# Patient Record
Sex: Male | Born: 1955 | Race: White | Hispanic: No | Marital: Married | Smoking: Former smoker
Health system: Southern US, Community
[De-identification: ages and names within clinical notes are randomized; demographics above are authoritative.]

## PROBLEM LIST (undated history)

## (undated) DIAGNOSIS — M1990 Unspecified osteoarthritis, unspecified site: Secondary | ICD-10-CM

## (undated) DIAGNOSIS — E7800 Pure hypercholesterolemia, unspecified: Secondary | ICD-10-CM

## (undated) HISTORY — PX: COLONOSCOPY: SHX174

---

## 2000-03-07 ENCOUNTER — Encounter: Admission: RE | Admit: 2000-03-07 | Discharge: 2000-06-05 | Payer: Self-pay | Admitting: Family Medicine

## 2005-05-03 ENCOUNTER — Ambulatory Visit: Payer: Self-pay | Admitting: Internal Medicine

## 2005-05-18 ENCOUNTER — Ambulatory Visit (HOSPITAL_COMMUNITY): Admission: RE | Admit: 2005-05-18 | Discharge: 2005-05-18 | Payer: Self-pay | Admitting: Internal Medicine

## 2005-05-18 ENCOUNTER — Ambulatory Visit: Payer: Self-pay | Admitting: Internal Medicine

## 2010-10-17 ENCOUNTER — Emergency Department (HOSPITAL_COMMUNITY)
Admission: EM | Admit: 2010-10-17 | Discharge: 2010-10-17 | Disposition: A | Discharge status: Home / Self Care | Payer: 59 | Attending: Emergency Medicine | Admitting: Emergency Medicine

## 2010-10-17 ENCOUNTER — Encounter: Payer: Self-pay | Admitting: *Deleted

## 2010-10-17 ENCOUNTER — Emergency Department (HOSPITAL_COMMUNITY): Payer: 59

## 2010-10-17 DIAGNOSIS — N200 Calculus of kidney: Secondary | ICD-10-CM

## 2010-10-17 DIAGNOSIS — R1032 Left lower quadrant pain: Secondary | ICD-10-CM | POA: Insufficient documentation

## 2010-10-17 DIAGNOSIS — Z794 Long term (current) use of insulin: Secondary | ICD-10-CM | POA: Insufficient documentation

## 2010-10-17 DIAGNOSIS — R1110 Vomiting, unspecified: Secondary | ICD-10-CM | POA: Insufficient documentation

## 2010-10-17 DIAGNOSIS — E119 Type 2 diabetes mellitus without complications: Secondary | ICD-10-CM | POA: Insufficient documentation

## 2010-10-17 LAB — URINE MICROSCOPIC-ADD ON

## 2010-10-17 LAB — URINALYSIS, ROUTINE W REFLEX MICROSCOPIC
Bilirubin Urine: NEGATIVE
Glucose, UA: 250 mg/dL — AB
Nitrite: NEGATIVE
Specific Gravity, Urine: >1.03 — ABNORMAL HIGH (ref 1.005–1.030)
pH: 5.5 (ref 5.0–8.0)

## 2010-10-17 MED ORDER — SODIUM CHLORIDE 0.9 % IV SOLN
Freq: Once | INTRAVENOUS | Status: AC
  Administered 2010-10-17: 05:00:00 via INTRAVENOUS

## 2010-10-17 MED ORDER — HYDROCODONE-ACETAMINOPHEN 5-325 MG PO TABS: 1.0000 | ORAL_TABLET | ORAL | Status: AC | PRN

## 2010-10-17 MED ORDER — HYDROMORPHONE HCL 1 MG/ML IJ SOLN
1.0000 mg | Freq: Once | INTRAMUSCULAR | Status: AC
  Administered 2010-10-17: 1 mg via INTRAVENOUS
  Filled 2010-10-17: qty 1

## 2010-10-17 MED ORDER — KETOROLAC TROMETHAMINE 30 MG/ML IJ SOLN
30.0000 mg | Freq: Once | INTRAMUSCULAR | Status: AC
  Administered 2010-10-17: 30 mg via INTRAVENOUS
  Filled 2010-10-17: qty 1

## 2010-10-17 MED ORDER — ONDANSETRON HCL 4 MG/2ML IJ SOLN
4.0000 mg | Freq: Once | INTRAMUSCULAR | Status: AC
  Administered 2010-10-17: 4 mg via INTRAVENOUS
  Filled 2010-10-17: qty 2

## 2010-10-17 MED ORDER — PROMETHAZINE HCL 25 MG PO TABS: 25.0000 mg | ORAL_TABLET | Freq: Four times a day (QID) | ORAL | Status: AC | PRN

## 2010-10-17 NOTE — ED Provider Notes (Signed)
History     CSN: [id number] Arrival date & time: 10/17/2010  2:52 AM   Chief Complaint  Patient presents with  . Flank Pain  . Emesis     (Include location/radiation/quality/duration/timing/severity/associated sxs/prior treatment) HPI Comments: Seen 25  Patient is a 55 y.o. male presenting with flank pain and vomiting. The history is provided by the patient.  Flank Pain This is a new problem. The current episode started 3 to 5 hours ago (patient awakened at 1030 with left flank pain. Has had two episodes of vomiting. Pain radiates to left abdomen). The problem has been gradually worsening. Associated symptoms include abdominal pain. The symptoms are aggravated by nothing. The symptoms are relieved by nothing. He has tried nothing for the symptoms.  Emesis  Associated symptoms include abdominal pain.     Past Medical History  Diagnosis Date  . Diabetes mellitus      History reviewed. No pertinent past surgical history.  History reviewed. No pertinent family history.  History  Substance Use Topics  . Smoking status: Current Everyday Smoker    Types: Cigarettes  . Smokeless tobacco: Not on file  . Alcohol Use: Yes     occ. use      Review of Systems  Gastrointestinal: Positive for vomiting and abdominal pain.  Genitourinary: Positive for flank pain.    Allergies  Review of patient's allergies indicates no known allergies.  Home Medications   Current Outpatient Rx  Name Route Sig Dispense Refill  . ASPIRIN 81 MG PO TABS Oral Take 81 mg by mouth daily.      . OMEGA-3 FATTY ACIDS 1000 MG PO CAPS Oral Take 3 g by mouth daily.      . INSULIN ASPART 100 UNIT/ML [place] SOLN Subcutaneous Inject into the skin 3 (three) times daily before meals.      . INSULIN DETEMIR 100 UNIT/ML [place] SOLN Subcutaneous Inject 35 Units into the skin 2 (two) times daily.     Marland Kitchen LOSARTAN POTASSIUM 100 MG PO TABS Oral Take 100 mg by mouth daily.      Marland Kitchen LOVASTATIN 20 MG PO TABS Oral Take 20 mg  by mouth at bedtime.      Marland Kitchen METFORMIN HCL 1000 MG PO TABS Oral Take 1,000 mg by mouth 2 (two) times daily with a meal.        Physical Exam    BP 150/80  Pulse 72  Temp 97.8 F (36.6 C)  Resp 16  Ht 5\' 10"  (1.778 m)  Wt 217 lb (98.431 kg)  BMI 31.14 kg/m2  SpO2 97%  Physical Exam  Nursing note and vitals reviewed. Constitutional: He is oriented to person, place, and time. He appears well-developed and well-nourished. He appears distressed.  HENT:  Head: Normocephalic and atraumatic.  Eyes: EOM are normal.  Neck: Normal range of motion. Neck supple.  Cardiovascular: Normal rate, normal heart sounds and intact distal pulses.   Pulmonary/Chest: Breath sounds normal.  Abdominal: Soft.       No focal area of pain with palpation  Genitourinary:       No cva tenderness  Musculoskeletal: Normal range of motion.  Neurological: He is alert and oriented to person, place, and time.  Skin: Skin is warm and dry.    ED Course  Procedures  Results for orders placed during the hospital encounter of 10/17/10  URINALYSIS, ROUTINE W REFLEX MICROSCOPIC      Component Value Range   Color, Urine YELLOW  YELLOW    Appearance  CLEAR  CLEAR    Specific Gravity, Urine >1.030 (*) 1.005 - 1.030    pH 5.5  5.0 - 8.0    Glucose, UA 250 (*) NEGATIVE (mg/dL)   Hgb urine dipstick LARGE (*) NEGATIVE    Bilirubin Urine NEGATIVE  NEGATIVE    Ketones, ur 15 (*) NEGATIVE (mg/dL)   Protein, ur NEGATIVE  NEGATIVE (mg/dL)   Urobilinogen, UA 0.2  0.0 - 1.0 (mg/dL)   Nitrite NEGATIVE  NEGATIVE    Leukocytes, UA NEGATIVE  NEGATIVE   URINE MICROSCOPIC-ADD ON      Component Value Range   Squamous Epithelial / LPF RARE  RARE    WBC, UA 0-2  <3 (WBC/hpf)   RBC / HPF 3-6  <3 (RBC/hpf)   Bacteria, UA RARE  RARE    Crystals CA OXALATE CRYSTALS (*) NEGATIVE    Ct Abdomen Pelvis Wo Contrast  10/17/2010  *RADIOLOGY REPORT*  Clinical Data: Left-sided flank pain.  CT ABDOMEN AND PELVIS WITHOUT CONTRAST   Technique:  Multidetector CT imaging of the abdomen and pelvis was performed following the standard protocol without intravenous contrast.  Comparison: None.  Findings: Limited images through the lung bases demonstrate no significant appreciable abnormality. The heart size is within normal limits. No pleural or pericardial effusion.  Organ evaluation is limited without intravenous contrast.  Within this limitation, diffuse low attenuation of the liver is in keeping with fatty infiltration.  Unremarkable biliary system, spleen, and adrenal glands.  Punctate calcification along the uncinate process may represent sequelae of prior pancreatitis.  There is bilateral perinephric fat stranding, left greater than right.  Interpolar cyst on the right.  A tiny too small to further characterize hypodensity arising exophytic from the right kidney.  Tiny nonobstructing left renal stone upper pole.  There is mild hydroureteronephrosis on the left to the level of a 3 mm left UVJ stone.  No bowel obstruction.  Large bowel diverticulosis without CT evidence for diverticulitis.  Normal appendix.  No free intraperitoneal air or fluid.  Reactive retroperitoneal lymph nodes.  Thin-walled bladder.  Bilateral fat containing inguinal hernias.  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  No acute osseous abnormality.  IMPRESSION: Mild left hydroureteronephrosis to the level of a 3 mm left UVJ stone.  Tiny nonobstructing left upper pole renal stone.  Hepatic steatosis.  Original Report Authenticated By: Waneta Martins, M.D.     Patient with left flank pain radiating to abdomen. No history of kidney stones. Urine with large hgb. History and urine c/w kidney stone. Confirmed by CT. Analgesics and antiemetics resolved pain and nausea. Pt feels improved after observation and/or treatment in ED.Patient and wife  informed of clinical course, understand medical decision-making process, and agree with plan. MDM Reviewed: nursing  note and vitals Interpretation: labs and CT scan      Nicoletta Dress. Colon Branch, MD 10/17/10 [phone]

## 2010-10-17 NOTE — ED Notes (Signed)
Left flank pain since 2230 last night with nausea and vomiting

## 2014-02-12 DIAGNOSIS — Z87891 Personal history of nicotine dependence: Secondary | ICD-10-CM | POA: Insufficient documentation

## 2014-02-12 DIAGNOSIS — K760 Fatty (change of) liver, not elsewhere classified: Secondary | ICD-10-CM | POA: Insufficient documentation

## 2014-02-12 DIAGNOSIS — N200 Calculus of kidney: Secondary | ICD-10-CM | POA: Insufficient documentation

## 2014-02-12 DIAGNOSIS — E669 Obesity, unspecified: Secondary | ICD-10-CM | POA: Insufficient documentation

## 2014-02-12 DIAGNOSIS — I10 Essential (primary) hypertension: Secondary | ICD-10-CM | POA: Insufficient documentation

## 2014-09-03 DIAGNOSIS — M353 Polymyalgia rheumatica: Secondary | ICD-10-CM | POA: Insufficient documentation

## 2016-01-21 ENCOUNTER — Telehealth: Payer: Self-pay

## 2016-01-21 NOTE — Telephone Encounter (Signed)
Patient called to schedule his colonoscopy. He said he received a letter about 5 yrs ago. Looks like he had been seeing NUR back in 2007. Patient doesn't remember who did his last colonoscopy. Please advise if he can be triaged or if he needs to follow up with [place]. [phone]

## 2016-01-27 NOTE — Telephone Encounter (Signed)
LMOM to call and also faxed Medical Records for last colonoscopy report.

## 2016-02-02 ENCOUNTER — Other Ambulatory Visit: Payer: Self-pay

## 2016-02-02 NOTE — Telephone Encounter (Addendum)
MOVI PREP SPLIT DOSING, FULL LIQUIDS WITH BREAKFAST.  insulin aspart (NOVOLOG) 100 UNIT/ML injection Inject 40 Units into the skin 3 (three) times daily before meals.      . Insulin Glargine (TOUJEO SOLOSTAR [place]) Inject 80 Units into the skin at bedtime.    Marland Kitchen losartan (COZAAR) 100 MG tablet Take 100 mg by mouth daily.      Marland Kitchen lovastatin (MEVACOR) 20 MG tablet Take 20 mg by mouth at bedtime.      . metFORMIN (GLUCOPHAGE) 1000 MG tablet Take 1,000 mg by mouth 2 (two) times daily with a meal.      ON DAY PRIOR TO TCS:  USE 5 UNITS NOVOLOG PRIOR TO MEALS IF BLOOD SUGAR > 200 USE 10 UNITS NOVOLOG PRIOR TO MEALS IF BLOOD SUGAR > 300 TAKE HALF TOUJEO ON NIGHT PRIOR TO TCS CONTINUE GLUCOPHAGE  ON MORNING OF TCS:  USE 5 UNITS NOVOLOG PRIOR TO MEALS IF BLOOD SUGAR > 200 USE 10 UNITS NOVOLOG PRIOR TO MEALS IF BLOOD SUGAR > 300 CONTINUE GLUCOPHAGE  Full Liquid Diet A high-calorie, high-protein supplement should be used to meet your nutritional requirements when the full liquid diet is continued for more than 2 or 3 days. If this diet is to be used for an extended period of time (more than 7 days), a multivitamin should be considered.  Breads and Starches  Allowed: None are allowed   Avoid: Any others.    Potatoes/Pasta/Rice  Allowed: ANY ITEM AS A SOUP OR SMALL PLATE OF MASHED POTATOES OR SCRAMBLED EGGS. (DO NOT EAT MORE THAN ONE SERVING ON THE DAY BEFORE COLONOSCOPY).    Vegetables  Allowed: Strained tomato or vegetable juice. Vegetables pureed in soup.   Avoid: Any others.    Fruit  Allowed: Any strained fruit juices and fruit drinks. Include 1 serving of citrus or vitamin C-enriched fruit juice daily.   Avoid: Any others.  Meat and Meat Substitutes  Allowed: Egg  Avoid: Any meat, fish, or fowl. All cheese.  Milk  Allowed: SOY Milk beverages, including milk shakes and instant breakfast mixes. Smooth yogurt.   Avoid: Any others. Avoid dairy products if not  tolerated.    Soups and Combination Foods  Allowed: Broth, strained cream soups. Strained, broth-based soups.   Avoid: Any others.    Desserts and Sweets  Allowed: flavored gelatin, tapioca, ice cream, sherbet, smooth pudding, junket, fruit ices, frozen ice pops, pudding pops, frozen fudge pops, chocolate syrup. Sugar, honey, jelly, syrup.   Avoid: Any others.  Fats and Oils  Allowed: Margarine, butter, cream, sour cream, oils.   Avoid: Any others.  Beverages  Allowed: All.   Avoid: None.  Condiments  Allowed: Iodized salt, pepper, spices, flavorings. Cocoa powder.   Avoid: Any others.    SAMPLE MEAL PLAN Breakfast   cup orange juice.   1 OR 2 EGGS  1 cup milk.   1 cup beverage (coffee or tea).   Cream or sugar, if desired.    Midmorning Snack  2 SCRAMBLED OR HARD BOILED EGG   Lunch  1 cup cream soup.    cup fruit juice.   1 cup milk.    cup custard.   1 cup beverage (coffee or tea).   Cream or sugar, if desired.    Midafternoon Snack  1 cup milk shake.  Dinner  1 cup cream soup.    cup fruit juice.   1 cup MILK    cup pudding.   1 cup beverage (coffee or  tea).   Cream or sugar, if desired.  Evening Snack  1 cup supplement.  To increase calories, add sugar, cream, butter, or margarine if possible. Nutritional supplements will also increase the total calories.

## 2016-02-02 NOTE — Telephone Encounter (Signed)
Last colonoscopy was done by Dr. Laural Golden on 05/18/2005.  Rectum biopsy: tubular adenoma. Triaged today for colonoscopy.

## 2016-02-02 NOTE — Telephone Encounter (Signed)
Gastroenterology Pre-Procedure Review  Request Date: 02/02/2016 Requesting Physician:   PATIENT REVIEW QUESTIONS: The patient responded to the following health history questions as indicated:    1. Diabetes Melitis: YES 2. Joint replacements in the past 12 months: no 3. Major health problems in the past 3 months: no 4. Has an artificial valve or MVP: no 5. Has a defibrillator: no 6. Has been advised in past to take antibiotics in advance of a procedure like teeth cleaning: no 7. Family history of colon cancer: no  8. Alcohol Use: no 9. History of sleep apnea: no  10. History of coronary artery or other vascular stents placed within the last 12 months: no    MEDICATIONS & ALLERGIES:    Patient reports the following regarding taking any blood thinners:   Plavix? no Aspirin?  YES Coumadin? no Brilinta? no Xarelto? no Eliquis? no Pradaxa? no Savaysa? no Effient? no  Patient confirms/reports the following medications:  Current Outpatient Prescriptions  Medication Sig Dispense Refill  . aspirin 81 MG tablet Take 81 mg by mouth daily.      . fish oil-omega-3 fatty acids 1000 MG capsule Take 3 g by mouth daily.      . insulin aspart (NOVOLOG) 100 UNIT/ML injection Inject 40 Units into the skin 3 (three) times daily before meals.     . Insulin Glargine (TOUJEO SOLOSTAR [place]) Inject 80 Units into the skin at bedtime.    Marland Kitchen losartan (COZAAR) 100 MG tablet Take 100 mg by mouth daily.      Marland Kitchen lovastatin (MEVACOR) 20 MG tablet Take 20 mg by mouth at bedtime.      . metFORMIN (GLUCOPHAGE) 1000 MG tablet Take 1,000 mg by mouth 2 (two) times daily with a meal.       No current facility-administered medications for this visit.     Patient confirms/reports the following allergies:  No Known Allergies  No orders of the defined types were placed in this encounter.   AUTHORIZATION INFORMATION Primary Insurance:  ID #:  Group #:  Pre-Cert / Auth required:  Pre-Cert / Auth #:   Secondary  Insurance:   ID #:   Group #:  Pre-Cert / Auth required:  Pre-Cert / Auth #:   SCHEDULE INFORMATION: Procedure has been scheduled as follows:  Date: 02/08/2016            Time:10:00 AM Location: [facility] Short Stay  This Gastroenterology Pre-Precedure Review Form is being routed to the following provider(s): Barney Drain, MD

## 2016-02-04 ENCOUNTER — Other Ambulatory Visit: Payer: Self-pay

## 2016-02-04 DIAGNOSIS — Z1211 Encounter for screening for malignant neoplasm of colon: Secondary | ICD-10-CM

## 2016-02-04 NOTE — Addendum Note (Signed)
Addended by: Everardo All on: 02/04/2016 09:01 AM   Modules accepted: Orders

## 2016-02-04 NOTE — Telephone Encounter (Signed)
I called the Movie [facility] to [place] at [facility] on [facility] in [place].[phone]) I told him I was faxing the instructions and to make sure the pt gets them.[phone] I also called and LMOM for pt that the Rx has been called in and his instructions were faxed there.

## 2016-02-07 NOTE — Telephone Encounter (Signed)
PA # [id number] for the colonoscopy.

## 2016-02-08 ENCOUNTER — Encounter (HOSPITAL_COMMUNITY)
Admission: RE | Disposition: A | Discharge status: Home / Self Care | Payer: Self-pay | Source: Ambulatory Visit | Attending: Gastroenterology

## 2016-02-08 ENCOUNTER — Encounter (HOSPITAL_COMMUNITY): Payer: Self-pay | Admitting: *Deleted

## 2016-02-08 ENCOUNTER — Ambulatory Visit (HOSPITAL_COMMUNITY)
Admission: RE | Admit: 2016-02-08 | Discharge: 2016-02-08 | Disposition: A | Discharge status: Home / Self Care | Payer: 59 | Source: Ambulatory Visit | Attending: Gastroenterology | Admitting: Gastroenterology

## 2016-02-08 DIAGNOSIS — Z87891 Personal history of nicotine dependence: Secondary | ICD-10-CM | POA: Insufficient documentation

## 2016-02-08 DIAGNOSIS — Q438 Other specified congenital malformations of intestine: Secondary | ICD-10-CM | POA: Diagnosis not present

## 2016-02-08 DIAGNOSIS — Z1211 Encounter for screening for malignant neoplasm of colon: Secondary | ICD-10-CM | POA: Diagnosis present

## 2016-02-08 DIAGNOSIS — Z1212 Encounter for screening for malignant neoplasm of rectum: Secondary | ICD-10-CM | POA: Diagnosis not present

## 2016-02-08 DIAGNOSIS — K648 Other hemorrhoids: Secondary | ICD-10-CM | POA: Diagnosis not present

## 2016-02-08 DIAGNOSIS — Z7982 Long term (current) use of aspirin: Secondary | ICD-10-CM | POA: Insufficient documentation

## 2016-02-08 DIAGNOSIS — Z79899 Other long term (current) drug therapy: Secondary | ICD-10-CM | POA: Insufficient documentation

## 2016-02-08 DIAGNOSIS — Z794 Long term (current) use of insulin: Secondary | ICD-10-CM | POA: Insufficient documentation

## 2016-02-08 DIAGNOSIS — E7800 Pure hypercholesterolemia, unspecified: Secondary | ICD-10-CM | POA: Diagnosis not present

## 2016-02-08 DIAGNOSIS — M1990 Unspecified osteoarthritis, unspecified site: Secondary | ICD-10-CM | POA: Insufficient documentation

## 2016-02-08 DIAGNOSIS — K644 Residual hemorrhoidal skin tags: Secondary | ICD-10-CM | POA: Diagnosis not present

## 2016-02-08 DIAGNOSIS — E119 Type 2 diabetes mellitus without complications: Secondary | ICD-10-CM | POA: Diagnosis not present

## 2016-02-08 DIAGNOSIS — K621 Rectal polyp: Secondary | ICD-10-CM | POA: Insufficient documentation

## 2016-02-08 HISTORY — PX: COLONOSCOPY: SHX5424

## 2016-02-08 HISTORY — DX: Unspecified osteoarthritis, unspecified site: M19.90

## 2016-02-08 HISTORY — DX: Pure hypercholesterolemia, unspecified: E78.00

## 2016-02-08 LAB — GLUCOSE, CAPILLARY: Glucose-Capillary: 226 mg/dL — ABNORMAL HIGH (ref 65–99)

## 2016-02-08 SURGERY — COLONOSCOPY
Anesthesia: Moderate Sedation

## 2016-02-08 MED ORDER — MEPERIDINE HCL 100 MG/ML IJ SOLN
INTRAMUSCULAR | Status: AC
  Filled 2016-02-08: qty 2

## 2016-02-08 MED ORDER — MEPERIDINE HCL 100 MG/ML IJ SOLN
INTRAMUSCULAR | Status: DC | PRN
  Administered 2016-02-08 (×3): 25 mg via INTRAVENOUS

## 2016-02-08 MED ORDER — MIDAZOLAM HCL 5 MG/5ML IJ SOLN
INTRAMUSCULAR | Status: AC
  Filled 2016-02-08: qty 10

## 2016-02-08 MED ORDER — STERILE WATER FOR IRRIGATION IR SOLN
Status: DC | PRN
  Administered 2016-02-08: 10:00:00

## 2016-02-08 MED ORDER — MIDAZOLAM HCL 5 MG/5ML IJ SOLN
INTRAMUSCULAR | Status: DC | PRN
  Administered 2016-02-08: 2 mg via INTRAVENOUS
  Administered 2016-02-08: 1 mg via INTRAVENOUS
  Administered 2016-02-08: 2 mg via INTRAVENOUS

## 2016-02-08 MED ORDER — SODIUM CHLORIDE 0.9 % IV SOLN
INTRAVENOUS | Status: DC
  Administered 2016-02-08: 09:00:00 via INTRAVENOUS

## 2016-02-08 MED ORDER — PEG-KCL-NACL-NASULF-NA ASC-C 100 G PO SOLR: 1.0000 | ORAL | 0 refills | Status: DC

## 2016-02-08 NOTE — Discharge Instructions (Signed)
You had 1 polyp removed. You have moderate internal hemorrhoids.   CONTINUE YOUR WEIGHT LOSS EFFORTS. LOSE TEN POUNDS.  WHILE I DO NOT WANT TO ALARM YOU, YOUR BODY MASS INDEX IS OVER 30 WHICH MEANS YOU ARE OBESE. OBESITY IS ASSOCIATED WITH AN INCREASE RISK FOR ALL CANCERS, INCLUDING ESOPHAGEAL AND COLON CANCER.   DRINK WATER TO KEEP YOUR URINE LIGHT YELLOW.  FOLLOW A HIGH FIBER DIET. AVOID ITEMS THAT CAUSE BLOATING & GAS. SEE INFO BELOW.  YOUR BIOPSY RESULTS WILL BE AVAILABLE IN MY CHART AFTER JAN 12 AND MY OFFICE WILL CONTACT YOU IN 10-14 DAYS WITH YOUR RESULTS.   Next colonoscopy in 5-10 years.    Colonoscopy Care After Read the instructions outlined below and refer to this sheet in the next week. These discharge instructions provide you with general information on caring for yourself after you leave the hospital. While your treatment has been planned according to the most current medical practices available, unavoidable complications occasionally occur. If you have any problems or questions after discharge, call DR. Abrahim Sargent, [phone].  ACTIVITY  You may resume your regular activity, but move at a slower pace for the next 24 hours.   Take frequent rest periods for the next 24 hours.   Walking will help get rid of the air and reduce the bloated feeling in your belly (abdomen).   No driving for 24 hours (because of the medicine (anesthesia) used during the test).   You may shower.   Do not sign any important legal documents or operate any machinery for 24 hours (because of the anesthesia used during the test).    NUTRITION  Drink plenty of fluids.   You may resume your normal diet as instructed by your doctor.   Begin with a light meal and progress to your normal diet. Heavy or fried foods are harder to digest and may make you feel sick to your stomach (nauseated).   Avoid alcoholic beverages for 24 hours or as instructed.    MEDICATIONS  You may resume your normal  medications.   WHAT YOU CAN EXPECT TODAY  Some feelings of bloating in the abdomen.   Passage of more gas than usual.   Spotting of blood in your stool or on the toilet paper  .  IF YOU HAD POLYPS REMOVED DURING THE COLONOSCOPY:  Eat a soft diet IF YOU HAVE NAUSEA, BLOATING, ABDOMINAL PAIN, OR VOMITING.    FINDING OUT THE RESULTS OF YOUR TEST Not all test results are available during your visit. DR. Oneida Alar WILL CALL YOU WITHIN 14 DAYS OF YOUR PROCEDUE WITH YOUR RESULTS. Do not assume everything is normal if you have not heard from DR. Azlin Zilberman, CALL HER OFFICE AT [phone].  SEEK IMMEDIATE MEDICAL ATTENTION AND CALL THE OFFICE: [phone] IF:  You have more than a spotting of blood in your stool.   Your belly is swollen (abdominal distention).   You are nauseated or vomiting.   You have a temperature over 101F.   You have abdominal pain or discomfort that is severe or gets worse throughout the day.   High-Fiber Diet A high-fiber diet changes your normal diet to include more whole grains, legumes, fruits, and vegetables. Changes in the diet involve replacing refined carbohydrates with unrefined foods. The calorie level of the diet is essentially unchanged. The Dietary Reference Intake (recommended amount) for adult males is 38 grams per day. For adult females, it is 25 grams per day. Pregnant and lactating women should consume 28 grams  of fiber per day. Fiber is the intact part of a plant that is not broken down during digestion. Functional fiber is fiber that has been isolated from the plant to provide a beneficial effect in the body. PURPOSE  Increase stool bulk.   Ease and regulate bowel movements.   Lower cholesterol.   REDUCE RISK OF COLON CANCER  INDICATIONS THAT YOU NEED MORE FIBER  Constipation and hemorrhoids.   Uncomplicated diverticulosis (intestine condition) and irritable bowel syndrome.   Weight management.   As a protective measure against  hardening of the arteries (atherosclerosis), diabetes, and cancer.   GUIDELINES FOR INCREASING FIBER IN THE DIET  Start adding fiber to the diet slowly. A gradual increase of about 5 more grams (2 slices of whole-wheat bread, 2 servings of most fruits or vegetables, or 1 bowl of high-fiber cereal) per day is best. Too rapid an increase in fiber may result in constipation, flatulence, and bloating.   Drink enough water and fluids to keep your urine clear or pale yellow. Water, juice, or caffeine-free drinks are recommended. Not drinking enough fluid may cause constipation.   Eat a variety of high-fiber foods rather than one type of fiber.   Try to increase your intake of fiber through using high-fiber foods rather than fiber pills or supplements that contain small amounts of fiber.   The goal is to change the types of food eaten. Do not supplement your present diet with high-fiber foods, but replace foods in your present diet.   INCLUDE A VARIETY OF FIBER SOURCES  Replace refined and processed grains with whole grains, canned fruits with fresh fruits, and incorporate other fiber sources. White rice, white breads, and most bakery goods contain little or no fiber.   Brown whole-grain rice, buckwheat oats, and many fruits and vegetables are all good sources of fiber. These include: broccoli, Brussels sprouts, cabbage, cauliflower, beets, sweet potatoes, white potatoes (skin on), carrots, tomatoes, eggplant, squash, berries, fresh fruits, and dried fruits.   Cereals appear to be the richest source of fiber. Cereal fiber is found in whole grains and bran. Bran is the fiber-rich outer coat of cereal grain, which is largely removed in refining. In whole-grain cereals, the bran remains. In breakfast cereals, the largest amount of fiber is found in those with "bran" in their names. The fiber content is sometimes indicated on the label.   You may need to include additional fruits and vegetables each day.     In baking, for 1 cup white flour, you may use the following substitutions:   1 cup whole-wheat flour minus 2 tablespoons.   1/2 cup white flour plus 1/2 cup whole-wheat flour.   Polyps, Colon  A polyp is extra tissue that grows inside your body. Colon polyps grow in the large intestine. The large intestine, also called the colon, is part of your digestive system. It is a long, hollow tube at the end of your digestive tract where your body makes and stores stool. Most polyps are not dangerous. They are benign. This means they are not cancerous. But over time, some types of polyps can turn into cancer. Polyps that are smaller than a pea are usually not harmful. But larger polyps could someday become or may already be cancerous. To be safe, doctors remove all polyps and test them.   WHO GETS POLYPS? Anyone can get polyps, but certain people are more likely than others. You may have a greater chance of getting polyps if:  You are over  32.   You have had polyps before.   Someone in your family has had polyps.   Someone in your family has had cancer of the large intestine.   Find out if someone in your family has had polyps. You may also be more likely to get polyps if you:   Eat a lot of fatty foods   Smoke   Drink alcohol   Do not exercise  Eat too much   PREVENTION There is not one sure way to prevent polyps. You might be able to lower your risk of getting them if you:  Eat more fruits and vegetables and less fatty food.   Do not smoke.   Avoid alcohol.   Exercise every day.   Lose weight if you are overweight.   Eating more calcium and folate can also lower your risk of getting polyps. Some foods that are rich in calcium are milk, cheese, and broccoli. Some foods that are rich in folate are chickpeas, kidney beans, and spinach.   Hemorrhoids Hemorrhoids are dilated (enlarged) veins around the rectum. Sometimes clots will form in the veins. This makes them swollen and  painful. These are called thrombosed hemorrhoids. Causes of hemorrhoids include:  Constipation.   Straining to have a bowel movement.   HEAVY LIFTING  HOME CARE INSTRUCTIONS  Eat a well balanced diet and drink 6 to 8 glasses of water every day to avoid constipation. You may also use a bulk laxative.   Avoid straining to have bowel movements.   Keep anal area dry and clean.   Do not use a donut shaped pillow or sit on the toilet for long periods. This increases blood pooling and pain.   Move your bowels when your body has the urge; this will require less straining and will decrease pain and pressure.

## 2016-02-08 NOTE — Op Note (Addendum)
[facility] Patient Name: Todd Carey Procedure Date: 02/08/2016 8:20 AM MRN: [id number] Date of Birth: 1955-03-10 Attending MD: Barney Drain , MD CSN: [id number] Age: 61 Admit Type: Outpatient Procedure:                Colonoscopy with COLD SNARE POLYPECTOMY Indications:              Screening for colorectal malignant neoplasm Providers:                Barney Drain, MD, Janeece Riggers, RN, Aram Candela Referring MD:             Dairl Ponder Medicines:                Meperidine 75 mg IV, Midazolam 5 mg IV Complications:            No immediate complications. Estimated Blood Loss:     Estimated blood loss was minimal. Procedure:                Pre-Anesthesia Assessment:                           - Prior to the procedure, a History and Physical                            was performed, and patient medications and                            allergies were reviewed. The patient's tolerance of                            previous anesthesia was also reviewed. The risks                            and benefits of the procedure and the sedation                            options and risks were discussed with the patient.                            All questions were answered, and informed consent                            was obtained. Prior Anticoagulants: The patient has                            taken aspirin, last dose was 1 day prior to                            procedure. ASA Grade Assessment: II - A patient                            with mild systemic disease. After reviewing the                            risks and benefits, the patient was deemed in  satisfactory condition to undergo the procedure.                            After obtaining informed consent, the colonoscope                            was passed under direct vision. Throughout the                            procedure, the patient's blood pressure, pulse, and      oxygen saturations were monitored continuously. The                            EC-[id number] scope was introduced through                            the anus and advanced to the 2 cm into the ileum.                            The colonoscopy was somewhat difficult due to a                            tortuous colon. Successful completion of the                            procedure was aided by COLOWRAP. The patient                            tolerated the procedure well. The quality of the                            bowel preparation was excellent. The terminal                            ileum, ileocecal valve, appendiceal orifice, and                            rectum were photographed. Scope In: 10:03:55 AM Scope Out: 10:19:25 AM Scope Withdrawal Time: 0 hours 13 minutes 11 seconds  Total Procedure Duration: 0 hours 15 minutes 30 seconds  Findings:      The digital rectal exam findings include non-thrombosed external       hemorrhoids.      A 5 mm polyp was found in the rectum. The polyp was sessile. The polyp       was removed with a cold snare. Resection and retrieval were complete.      The recto-sigmoid colon and sigmoid colon were moderately redundant.      Non-bleeding internal hemorrhoids were found. The hemorrhoids were       moderate. Impression:               - Non-thrombosed external hemorrhoids found on                            digital rectal exam.                           -  One 5 mm polyp in the rectum, removed with a cold                            snare. Resected and retrieved.                           - Redundant LEFT colon.                           - Non-bleeding internal hemorrhoids. Moderate Sedation:      Moderate (conscious) sedation was administered by the endoscopy nurse       and supervised by the endoscopist. The following parameters were       monitored: oxygen saturation, heart rate, blood pressure, and response       to care. Total physician  intraservice time was 34 minutes. Recommendation:           - High fiber diet.                           - Continue present medications.                           - Await pathology results.                           - Repeat colonoscopy in 5-10 years for surveillance.                           - Patient has a contact number available for                            emergencies. The signs and symptoms of potential                            delayed complications were discussed with the                            patient. Return to normal activities tomorrow.                            Written discharge instructions were provided to the                            patient. Procedure Code(s):        --- Professional ---                           [phone], Colonoscopy, flexible; with removal of                            tumor(s), polyp(s), or other lesion(s) by snare                            technique  [id number], Moderate sedation services provided by the                            same physician or other qualified health care                            professional performing the diagnostic or                            therapeutic service that the sedation supports,                            requiring the presence of an independent trained                            observer to assist in the monitoring of the                            patient's level of consciousness and physiological                            status; initial 15 minutes of intraservice time,                            patient age 28 years or older                           [phone], Moderate sedation services; each additional                            15 minutes intraservice time Diagnosis Code(s):        --- Professional ---                           Z12.11, Encounter for screening for malignant                            neoplasm of colon                           K62.1, Rectal polyp                            K64.4, Residual hemorrhoidal skin tags                           K64.8, Other hemorrhoids                           Q43.8, Other specified congenital malformations of                            intestine CPT copyright 2016 [employer]. All rights reserved. The codes documented in this report are preliminary and upon coder review may  be revised to meet current compliance requirements. Barney Drain, MD Barney Drain, MD 02/08/2016 10:33:51  AM This report has been signed electronically. Number of Addenda: 0

## 2016-02-08 NOTE — H&P (Signed)
  Primary Care Physician:  Dairl Ponder, MD Primary Gastroenterologist:  Dr. Oneida Alar  Pre-Procedure History & Physical: HPI:  Todd Carey is a 61 y.o. male here for COLON CANCER SCREENING.  Past Medical History:  Diagnosis Date  . Arthritis    Right shoulder   . Diabetes mellitus   . Hypercholesteremia     Past Surgical History:  Procedure Laterality Date  . COLONOSCOPY      Prior to Admission medications   Medication Sig Start Date End Date Taking? Authorizing Provider  aspirin 81 MG tablet Take 81 mg by mouth daily.     Yes Historical Provider, MD  Cholecalciferol (VITAMIN D) 2000 units tablet Take 2,000 Units by mouth daily.   Yes Historical Provider, MD  fish oil-omega-3 fatty acids 1000 MG capsule Take 3 g by mouth daily.     Yes Historical Provider, MD  insulin aspart (NOVOLOG) 100 UNIT/ML injection Inject 40 Units into the skin 3 (three) times daily before meals.    Yes Historical Provider, MD  Insulin Glargine (TOUJEO SOLOSTAR [place]) Inject 80 Units into the skin at bedtime.   Yes Historical Provider, MD  losartan (COZAAR) 100 MG tablet Take 100 mg by mouth daily.     Yes Historical Provider, MD  lovastatin (MEVACOR) 20 MG tablet Take 20 mg by mouth at bedtime.     Yes Historical Provider, MD  metFORMIN (GLUCOPHAGE) 1000 MG tablet Take 1,000 mg by mouth 2 (two) times daily with a meal.     Yes Historical Provider, MD  peg 3350 powder (MOVIPREP) 100 g SOLR Take 1 kit (200 g total) by mouth as directed. 02/08/16  Yes Danie Binder, MD    Allergies as of 02/04/2016  . (No Known Allergies)    Family History  Problem Relation Age of Onset  . Colon cancer Neg Hx     Social History   Social History  . Marital status: Married    Spouse name: N/A  . Number of children: N/A  . Years of education: N/A   Occupational History  . Not on file.   Social History Main Topics  . Smoking status: Former Smoker    Packs/day: 1.00    Years: 38.00    Types: Cigarettes   Quit date: 04/18/2013  . Smokeless tobacco: Former Systems developer  . Alcohol use No  . Drug use: No  . Sexual activity: Not on file   Other Topics Concern  . Not on file   Social History Narrative  . No narrative on file    Review of Systems: See HPI, otherwise negative ROS   Physical Exam: BP (!) 159/82   Pulse 77   Temp 97.4 F (36.3 C) (Oral)   Resp 18   Ht '5\' 10"'$  (1.778 m)   Wt 222 lb (100.7 kg)   SpO2 98%   BMI 31.85 kg/m  General:   Alert,  pleasant and cooperative in NAD Head:  Normocephalic and atraumatic. Neck:  Supple; Lungs:  Clear throughout to auscultation.    Heart:  Regular rate and rhythm. Abdomen:  Soft, nontender and nondistended. Normal bowel sounds, without guarding, and without rebound.   Neurologic:  Alert and  oriented x4;  grossly normal neurologically.  Impression/Plan:     SCREENING  Plan:  1. TCS TODAY. DISCUSSED PROCEDURE, BENEFITS, & RISKS: < 1% chance of medication reaction, bleeding, perforation, or rupture of spleen/liver.

## 2016-02-10 ENCOUNTER — Encounter (HOSPITAL_COMMUNITY): Payer: Self-pay | Admitting: Gastroenterology

## 2016-02-15 ENCOUNTER — Telehealth: Payer: Self-pay | Admitting: Gastroenterology

## 2016-02-15 NOTE — Telephone Encounter (Signed)
PT is aware.

## 2016-02-15 NOTE — Telephone Encounter (Signed)
Please call pt. He had A HYPERPLASTIC POLYP removed.  CONTINUE YOUR WEIGHT LOSS EFFORTS. DRINK WATER TO KEEP YOUR URINE LIGHT YELLOW. FOLLOW A HIGH FIBER DIET. AVOID ITEMS THAT CAUSE BLOATING & GAS.  Next colonoscopy in 5-10 years BE CAUSE YOU HAD A SIMPLE ADENOMA REMOVED ON YOUR FIRST COLONOSCOPY.

## 2016-02-15 NOTE — Telephone Encounter (Signed)
LMOM to call.

## 2016-02-15 NOTE — Telephone Encounter (Signed)
Reminder in epic °

## 2017-04-23 LAB — BASIC METABOLIC PANEL
BUN: 14 (ref 4–21)
CREATININE: 1 (ref <=1.3)

## 2017-04-23 LAB — HEMOGLOBIN A1C: HEMOGLOBIN A1C: 9.3

## 2017-06-06 ENCOUNTER — Ambulatory Visit: Payer: Self-pay | Admitting: "Endocrinology

## 2017-07-09 ENCOUNTER — Ambulatory Visit: Payer: 59 | Admitting: "Endocrinology

## 2017-07-09 ENCOUNTER — Encounter: Payer: Self-pay | Admitting: "Endocrinology

## 2017-07-09 VITALS — BP 142/74 | HR 80 | Ht 70.0 in | Wt 237.0 lb

## 2017-07-09 DIAGNOSIS — E782 Mixed hyperlipidemia: Secondary | ICD-10-CM

## 2017-07-09 DIAGNOSIS — Z6834 Body mass index (BMI) 34.0-34.9, adult: Secondary | ICD-10-CM | POA: Diagnosis not present

## 2017-07-09 DIAGNOSIS — I10 Essential (primary) hypertension: Secondary | ICD-10-CM | POA: Diagnosis not present

## 2017-07-09 DIAGNOSIS — E1165 Type 2 diabetes mellitus with hyperglycemia: Secondary | ICD-10-CM | POA: Insufficient documentation

## 2017-07-09 DIAGNOSIS — E6609 Other obesity due to excess calories: Secondary | ICD-10-CM | POA: Diagnosis not present

## 2017-07-09 NOTE — Progress Notes (Signed)
Endocrinology Consult Note       07/09/2017, 4:54 PM   Subjective:    Patient ID: Todd Carey, male    DOB: May 29, 1955.  Todd Carey is being seen in consultation for management of currently uncontrolled symptomatic diabetes requested by  Dairl Ponder, MD.   Past Medical History:  Diagnosis Date  . Arthritis    Right shoulder   . Diabetes mellitus   . Hypercholesteremia    Past Surgical History:  Procedure Laterality Date  . COLONOSCOPY    . COLONOSCOPY N/A 02/08/2016   Procedure: COLONOSCOPY;  Surgeon: Danie Binder, MD;  Location: [facility];  Service: Endoscopy;  Laterality: N/A;  10:00 Am   Social History   Socioeconomic History  . Marital status: Married    Spouse name: Not on file  . Number of children: Not on file  . Years of education: Not on file  . Highest education level: Not on file  Occupational History  . Not on file  Social Needs  . Financial resource strain: Not on file  . Food insecurity:    Worry: Not on file    Inability: Not on file  . Transportation needs:    Medical: Not on file    Non-medical: Not on file  Tobacco Use  . Smoking status: Former Smoker    Packs/day: 1.00    Years: 38.00    Pack years: 38.00    Types: Cigarettes    Last attempt to quit: 04/18/2013    Years since quitting: 4.2  . Smokeless tobacco: Former Network engineer and Sexual Activity  . Alcohol use: No  . Drug use: No  . Sexual activity: Not on file  Lifestyle  . Physical activity:    Days per week: Not on file    Minutes per session: Not on file  . Stress: Not on file  Relationships  . Social connections:    Talks on phone: Not on file    Gets together: Not on file    Attends religious service: Not on file    Active member of club or organization: Not on file    Attends meetings of clubs or organizations: Not on file    Relationship status: Not on file  Other  Topics Concern  . Not on file  Social History Narrative  . Not on file   Outpatient Encounter Medications as of 07/09/2017  Medication Sig  . atorvastatin (LIPITOR) 40 MG tablet Take 40 mg by mouth daily.  . Exenatide ER 2 MG/0.85ML AUIJ Inject into the skin once a week.  . Insulin Detemir (LEVEMIR FLEXTOUCH) 100 UNIT/ML Pen Inject 80 Units into the skin at bedtime. 40 units qam & 80 units at night   . insulin lispro (HUMALOG KWIKPEN) 100 UNIT/ML KiwkPen Inject 20-26 Units into the skin 3 (three) times daily before meals.  Marland Kitchen losartan (COZAAR) 100 MG tablet Take 100 mg by mouth daily.  Marland Kitchen aspirin 81 MG tablet Take 81 mg by mouth daily.    . Cholecalciferol (VITAMIN D) 2000 units tablet Take 2,000 Units by mouth daily.  . fish oil-omega-3 fatty acids  1000 MG capsule Take 3 g by mouth daily.    . metFORMIN (GLUCOPHAGE) 1000 MG tablet Take 1,000 mg by mouth 2 (two) times daily with a meal.    . [DISCONTINUED] insulin aspart (NOVOLOG) 100 UNIT/ML injection Inject 40 Units into the skin 3 (three) times daily before meals.   . [DISCONTINUED] Insulin Glargine (TOUJEO SOLOSTAR [place]) Inject 80 Units into the skin at bedtime.  . [DISCONTINUED] losartan (COZAAR) 100 MG tablet Take 100 mg by mouth daily.    . [DISCONTINUED] lovastatin (MEVACOR) 20 MG tablet Take 20 mg by mouth at bedtime.     No facility-administered encounter medications on file as of 07/09/2017.     ALLERGIES: No Known Allergies  VACCINATION STATUS:  There is no immunization history on file for this patient.  Diabetes  He presents for his initial diabetic visit. He has type 2 diabetes mellitus. Onset time: He was diagnosed at approximate age of 62 years. His disease course has been worsening. There are no hypoglycemic associated symptoms. Pertinent negatives for hypoglycemia include no confusion, headaches, pallor or seizures. Associated symptoms include blurred vision, polydipsia and polyuria. Pertinent negatives for diabetes  include no chest pain, no fatigue, no polyphagia and no weakness. There are no hypoglycemic complications. Symptoms are worsening. Risk factors for coronary artery disease include diabetes mellitus, dyslipidemia, family history, male sex, hypertension, obesity, sedentary lifestyle and tobacco exposure. Current diabetic treatments: He is currently on Levemir 120 units daily, Humalog 60 units 3 times daily, Bydureon 2 mg weekly, metformin 1000 mg twice a day. His weight is increasing steadily. He is following a generally unhealthy diet. When asked about meal planning, he reported none. He has not had a previous visit with a dietitian. He rarely participates in exercise. His home blood glucose trend is fluctuating minimally. (He brought a log showing significantly fluctuating, however significantly above target blood glucose profile.  He did not document his insulin injection activities.    His most recent A1c was 9.3% on April 23, 2017.) An ACE inhibitor/angiotensin II receptor blocker is being taken.  Hyperlipidemia  This is a chronic problem. The current episode started more than 1 year ago. The problem is uncontrolled. Exacerbating diseases include diabetes and obesity. Pertinent negatives include no chest pain, myalgias or shortness of breath. Current antihyperlipidemic treatment includes statins. Risk factors for coronary artery disease include dyslipidemia, diabetes mellitus, family history, male sex, obesity, hypertension and a sedentary lifestyle.  Hypertension  This is a chronic problem. The current episode started more than 1 year ago. The problem is uncontrolled. Associated symptoms include blurred vision. Pertinent negatives include no chest pain, headaches, neck pain, palpitations or shortness of breath. Risk factors for coronary artery disease include dyslipidemia, diabetes mellitus, obesity, male gender, smoking/tobacco exposure and sedentary lifestyle. Past treatments include angiotensin blockers.       Review of Systems  Constitutional: Negative for chills, fatigue, fever and unexpected weight change.  HENT: Negative for dental problem, mouth sores and trouble swallowing.   Eyes: Positive for blurred vision. Negative for visual disturbance.  Respiratory: Negative for cough, choking, chest tightness, shortness of breath and wheezing.   Cardiovascular: Negative for chest pain, palpitations and leg swelling.  Gastrointestinal: Negative for abdominal distention, abdominal pain, constipation, diarrhea, nausea and vomiting.  Endocrine: Positive for polydipsia and polyuria. Negative for polyphagia.  Genitourinary: Negative for dysuria, flank pain, hematuria and urgency.  Musculoskeletal: Negative for back pain, gait problem, myalgias and neck pain.  Skin: Negative for pallor, rash and wound.  Neurological: Negative for seizures, syncope, weakness, numbness and headaches.  Psychiatric/Behavioral: Negative.  Negative for confusion and dysphoric mood.    Objective:    BP (!) 142/74   Pulse 80   Ht 5\' 10"  (1.778 m)   Wt 237 lb (107.5 kg)   BMI 34.01 kg/m   Wt Readings from Last 3 Encounters:  07/09/17 237 lb (107.5 kg)  02/08/16 222 lb (100.7 kg)  10/17/10 217 lb (98.4 kg)     Physical Exam  Constitutional: He is oriented to person, place, and time. He appears well-developed. He is cooperative. No distress.  HENT:  Head: Normocephalic and atraumatic.  Eyes: EOM are normal.  Neck: Normal range of motion. Neck supple. No tracheal deviation present. No thyromegaly present.  Cardiovascular: Normal rate, S1 normal, S2 normal and normal heart sounds. Exam reveals no gallop.  No murmur heard. Pulses:      Dorsalis pedis pulses are 1+ on the right side, and 1+ on the left side.       Posterior tibial pulses are 1+ on the right side, and 1+ on the left side.  Pulmonary/Chest: Breath sounds normal. No respiratory distress. He has no wheezes.  Abdominal: Soft. Bowel sounds are normal.  He exhibits no distension. There is no tenderness. There is no guarding and no CVA tenderness.  Musculoskeletal: He exhibits no edema.       Right shoulder: He exhibits no swelling and no deformity.  Neurological: He is alert and oriented to person, place, and time. He has normal strength and normal reflexes. No cranial nerve deficit or sensory deficit. Gait normal.  Skin: Skin is warm and dry. No rash noted. No cyanosis. Nails show no clubbing.  Psychiatric: He has a normal mood and affect. His speech is normal. Judgment normal. Cognition and memory are normal.   Recent Results (from the past 2160 hour(s))  Basic metabolic panel     Status: None   Collection Time: 04/23/17 12:00 AM  Result Value Ref Range   BUN 14 4 - 21   Creatinine 1.0 0.6 - 1.3  Hemoglobin A1c     Status: None   Collection Time: 04/23/17 12:00 AM  Result Value Ref Range   Hemoglobin A1C 9.3       Assessment & Plan:   1. Uncontrolled type 2 diabetes mellitus with hyperglycemia ([place])  - Itzae R Edmonston has currently uncontrolled symptomatic type 2 DM since 62 years of age,  with most recent A1c of 9.3 %. Recent labs reviewed.  -his diabetes is complicated by obesity/sedentary life, and ARSHIA RONDON remains at a high risk for more acute and chronic complications which include CAD, CVA, CKD, retinopathy, and neuropathy. These are all discussed in detail with the patient.  - I have counseled him on diet management and weight loss, by adopting a carbohydrate restricted/protein rich diet.  - Suggestion is made for him to avoid simple carbohydrates  from his diet including Cakes, Sweet Desserts, Ice Cream, Soda (diet and regular), Sweet Tea, Candies, Chips, Cookies, Store Bought Juices, Alcohol in Excess of  1-2 drinks a day, Artificial Sweeteners, and "Sugar-free" Products. This will help patient to have stable blood glucose profile and potentially avoid unintended weight gain.  - I encouraged him to switch to   unprocessed or minimally processed complex starch and increased protein intake (animal or plant source), fruits, and vegetables.  - he is advised to stick to a routine mealtimes to eat 3 meals  a day and avoid  unnecessary snacks ( to snack only to correct hypoglycemia).   -He follows with a dietitian in [place].  - I have approached him with the following individualized plan to manage diabetes and patient agrees:   -He is on a particularly high-dose insulin, at risk for hypoglycemia.  -I approached him for simplified treatment with less insulin and he accepts.  -I discussed and lowered his Levemir to 80 units nightly, discussed and lowered his Humalog to 20 units 3 times daily AC for pre-meal blood glucose  of 90-150mg /dl, plus patient specific correction dose for unexpected hyperglycemia above 150mg /dl, associated with strict monitoring of glucose 4 times a day-before meals and at bedtime. - Patient is warned not to take insulin without proper monitoring per orders. -Adjustment parameters are given for hypo and hyperglycemia in writing. -Patient is encouraged to call clinic for blood glucose levels less than 70 or above 300 mg /dl. - I will continue metformin 1000 mg p.o. twice daily, therapeutically suitable for patient . -She is advised to continue Bydureon 2 mg subcutaneously weekly.   - Patient specific target  A1c;  LDL, HDL, Triglycerides, and  Waist Circumference were discussed in detail.  2) BP/HTN: His blood pressure is not controlled to target.  He is advised to continue current medications including losartan 100 mg p.o. Daily.  3) Lipids/HPL:   No recent lipid panel to review.  He is on atorvastatin 40 mg p.o. nightly, advised to continue with the same.  4)  Weight/Diet: CDE Consult will be initiated , exercise, and detailed carbohydrates information provided.  5) Chronic Care/Health Maintenance:  -he  is on ACEI/ARB and Statin medications and  is encouraged to continue to  follow up with Ophthalmology, Dentist,  Podiatrist at least yearly or according to recommendations, and advised to  stay away from smoking. I have recommended yearly flu vaccine and pneumonia vaccination at least every 5 years; moderate intensity exercise for up to 150 minutes weekly; and  sleep for at least 7 hours a day.  - I advised patient to maintain close follow up with Dairl Ponder, MD for primary care needs.  - Time spent with the patient: 45 minutes, of which >50% was spent in obtaining information about his symptoms, reviewing his previous labs, evaluations, and treatments, counseling him about his currently uncontrolled type 2 diabetes, hyperlipidemia, hypertension, and developing a plan to confirm the diagnosis and long term treatment as necessary.  Rudean Hitt participated in the discussions, expressed understanding, and voiced agreement with the above plans.  All questions were answered to his satisfaction. he is encouraged to contact clinic should he have any questions or concerns prior to his return visit.  Follow up plan: - Return in about 1 week (around 07/16/2017) for follow up with meter and logs- no labs.  Glade Lloyd, MD [facility] [address] [place], [place] [address] Phone: [phone]  Fax: [phone]    07/09/2017, 4:54 PM  This note was partially dictated with voice recognition software. Similar sounding words can be transcribed inadequately or may not  be corrected upon review.

## 2017-07-09 NOTE — Patient Instructions (Signed)

## 2017-07-23 ENCOUNTER — Encounter: Payer: Self-pay | Admitting: "Endocrinology

## 2017-07-23 ENCOUNTER — Ambulatory Visit: Payer: 59 | Admitting: "Endocrinology

## 2017-07-23 VITALS — BP 133/79 | HR 69 | Ht 70.0 in | Wt 231.0 lb

## 2017-07-23 DIAGNOSIS — E782 Mixed hyperlipidemia: Secondary | ICD-10-CM

## 2017-07-23 DIAGNOSIS — E1165 Type 2 diabetes mellitus with hyperglycemia: Secondary | ICD-10-CM | POA: Diagnosis not present

## 2017-07-23 DIAGNOSIS — I10 Essential (primary) hypertension: Secondary | ICD-10-CM

## 2017-07-23 NOTE — Progress Notes (Signed)
Endocrinology Consult Note       07/23/2017, 5:36 PM   Subjective:    Patient ID: Todd Carey, male    DOB: 1955/04/01.  Todd Carey is being seen in consultation for management of currently uncontrolled symptomatic diabetes requested by  Dairl Ponder, MD.   Past Medical History:  Diagnosis Date  . Arthritis    Right shoulder   . Diabetes mellitus   . Hypercholesteremia    Past Surgical History:  Procedure Laterality Date  . COLONOSCOPY    . COLONOSCOPY N/A 02/08/2016   Procedure: COLONOSCOPY;  Surgeon: Danie Binder, MD;  Location: [facility];  Service: Endoscopy;  Laterality: N/A;  10:00 Am   Social History   Socioeconomic History  . Marital status: Married    Spouse name: Not on file  . Number of children: Not on file  . Years of education: Not on file  . Highest education level: Not on file  Occupational History  . Not on file  Social Needs  . Financial resource strain: Not on file  . Food insecurity:    Worry: Not on file    Inability: Not on file  . Transportation needs:    Medical: Not on file    Non-medical: Not on file  Tobacco Use  . Smoking status: Former Smoker    Packs/day: 1.00    Years: 38.00    Pack years: 38.00    Types: Cigarettes    Last attempt to quit: 04/18/2013    Years since quitting: 4.2  . Smokeless tobacco: Former Network engineer and Sexual Activity  . Alcohol use: No  . Drug use: No  . Sexual activity: Not on file  Lifestyle  . Physical activity:    Days per week: Not on file    Minutes per session: Not on file  . Stress: Not on file  Relationships  . Social connections:    Talks on phone: Not on file    Gets together: Not on file    Attends religious service: Not on file    Active member of club or organization: Not on file    Attends meetings of clubs or organizations: Not on file    Relationship status: Not on file  Other  Topics Concern  . Not on file  Social History Narrative  . Not on file   Outpatient Encounter Medications as of 07/23/2017  Medication Sig  . aspirin 81 MG tablet Take 81 mg by mouth daily.    Marland Kitchen atorvastatin (LIPITOR) 40 MG tablet Take 40 mg by mouth daily.  . Cholecalciferol (VITAMIN D) 2000 units tablet Take 2,000 Units by mouth daily.  . Exenatide ER 2 MG/0.85ML AUIJ Inject into the skin once a week.  . fish oil-omega-3 fatty acids 1000 MG capsule Take 3 g by mouth daily.    . Insulin Detemir (LEVEMIR FLEXTOUCH) 100 UNIT/ML Pen Inject 80 Units into the skin at bedtime. 40 units qam & 80 units at night   . insulin lispro (HUMALOG KWIKPEN) 100 UNIT/ML KiwkPen Inject 22-28 Units into the skin 3 (three) times daily before meals.  Marland Kitchen  losartan (COZAAR) 100 MG tablet Take 100 mg by mouth daily.  . metFORMIN (GLUCOPHAGE) 1000 MG tablet Take 1,000 mg by mouth 2 (two) times daily with a meal.     No facility-administered encounter medications on file as of 07/23/2017.     ALLERGIES: No Known Allergies  VACCINATION STATUS:  There is no immunization history on file for this patient.  Diabetes  He presents for his follow-up diabetic visit. He has type 2 diabetes mellitus. Onset time: He was diagnosed at approximate age of 29 years. His disease course has been worsening. There are no hypoglycemic associated symptoms. Pertinent negatives for hypoglycemia include no confusion, headaches, pallor or seizures. Associated symptoms include blurred vision, polydipsia and polyuria. Pertinent negatives for diabetes include no chest pain, no fatigue, no polyphagia and no weakness. There are no hypoglycemic complications. Symptoms are improving. Risk factors for coronary artery disease include diabetes mellitus, dyslipidemia, family history, male sex, hypertension, obesity, sedentary lifestyle and tobacco exposure. Current diabetic treatments: He is currently on Levemir 80 units daily, Humalog 15 units 3 times  daily, Bydureon 2 mg weekly, metformin 1000 mg twice a day. His weight is decreasing steadily. He is following a generally unhealthy diet. When asked about meal planning, he reported none. He has not had a previous visit with a dietitian. He rarely participates in exercise. His home blood glucose trend is fluctuating minimally. His breakfast blood glucose range is generally 180-200 mg/dl. His lunch blood glucose range is generally 180-200 mg/dl. His dinner blood glucose range is generally 180-200 mg/dl. His bedtime blood glucose range is generally >200 mg/dl. His overall blood glucose range is >200 mg/dl. ( His most recent A1c was 9.3% on April 23, 2017.) An ACE inhibitor/angiotensin II receptor blocker is being taken.  Hyperlipidemia  This is a chronic problem. The current episode started more than 1 year ago. The problem is uncontrolled. Exacerbating diseases include diabetes and obesity. Pertinent negatives include no chest pain, myalgias or shortness of breath. Current antihyperlipidemic treatment includes statins. Risk factors for coronary artery disease include dyslipidemia, diabetes mellitus, family history, male sex, obesity, hypertension and a sedentary lifestyle.  Hypertension  This is a chronic problem. The current episode started more than 1 year ago. The problem is uncontrolled. Associated symptoms include blurred vision. Pertinent negatives include no chest pain, headaches, neck pain, palpitations or shortness of breath. Risk factors for coronary artery disease include dyslipidemia, diabetes mellitus, obesity, male gender, smoking/tobacco exposure and sedentary lifestyle. Past treatments include angiotensin blockers.      Review of Systems  Constitutional: Negative for chills, fatigue, fever and unexpected weight change.  HENT: Negative for dental problem, mouth sores and trouble swallowing.   Eyes: Positive for blurred vision. Negative for visual disturbance.  Respiratory: Negative for  cough, choking, chest tightness, shortness of breath and wheezing.   Cardiovascular: Negative for chest pain, palpitations and leg swelling.  Gastrointestinal: Negative for abdominal distention, abdominal pain, constipation, diarrhea, nausea and vomiting.  Endocrine: Positive for polydipsia and polyuria. Negative for polyphagia.  Genitourinary: Negative for dysuria, flank pain, hematuria and urgency.  Musculoskeletal: Negative for back pain, gait problem, myalgias and neck pain.  Skin: Negative for pallor, rash and wound.  Neurological: Negative for seizures, syncope, weakness, numbness and headaches.  Psychiatric/Behavioral: Negative.  Negative for confusion and dysphoric mood.    Objective:    BP 133/79   Pulse 69   Ht 5\' 10"  (1.778 m)   Wt 231 lb (104.8 kg)   BMI 33.15 kg/m  Wt Readings from Last 3 Encounters:  07/23/17 231 lb (104.8 kg)  07/09/17 237 lb (107.5 kg)  02/08/16 222 lb (100.7 kg)     Physical Exam  Constitutional: He is oriented to person, place, and time. He appears well-developed. He is cooperative. No distress.  HENT:  Head: Normocephalic and atraumatic.  Eyes: EOM are normal.  Neck: Normal range of motion. Neck supple. No tracheal deviation present. No thyromegaly present.  Cardiovascular: Normal rate, S1 normal and S2 normal. Exam reveals no gallop.  No murmur heard. Pulses:      Dorsalis pedis pulses are 1+ on the right side, and 1+ on the left side.       Posterior tibial pulses are 1+ on the right side, and 1+ on the left side.  Pulmonary/Chest: Effort normal. No respiratory distress. He has no wheezes.  Abdominal: He exhibits no distension. There is no tenderness. There is no guarding and no CVA tenderness.  Musculoskeletal: He exhibits no edema.       Right shoulder: He exhibits no swelling and no deformity.  Neurological: He is alert and oriented to person, place, and time. He has normal strength and normal reflexes. No cranial nerve deficit or  sensory deficit. Gait normal.  Skin: Skin is warm and dry. No rash noted. No cyanosis. Nails show no clubbing.  Psychiatric: He has a normal mood and affect. His speech is normal. Judgment normal. Cognition and memory are normal.    April 23, 2017 labs: BUN 14, creatinine 1.0, A1c 9.3%  Assessment & Plan:   1. Uncontrolled type 2 diabetes mellitus with hyperglycemia ([place])  - Todd Carey has currently uncontrolled symptomatic type 2 DM since 62 years of age. -Returns with improved glycemic profile, still above target.  His most recent labs showed A1c of 9.3%.  - Recent labs reviewed.  -his diabetes is complicated by obesity/sedentary life, and Todd Carey remains at a high risk for more acute and chronic complications which include CAD, CVA, CKD, retinopathy, and neuropathy. These are all discussed in detail with the patient.  - I have counseled him on diet management and weight loss, by adopting a carbohydrate restricted/protein rich diet.  -  Suggestion is made for him to avoid simple carbohydrates  from his diet including Cakes, Sweet Desserts / Pastries, Ice Cream, Soda (diet and regular), Sweet Tea, Candies, Chips, Cookies, Store Bought Juices, Alcohol in Excess of  1-2 drinks a day, Artificial Sweeteners, and "Sugar-free" Products. This will help patient to have stable blood glucose profile and potentially avoid unintended weight gain.   - I encouraged him to switch to  unprocessed or minimally processed complex starch and increased protein intake (animal or plant source), fruits, and vegetables.  - he is advised to stick to a routine mealtimes to eat 3 meals  a day and avoid unnecessary snacks ( to snack only to correct hypoglycemia).   -He follows with a dietitian in [place].  - I have approached him with the following individualized plan to manage diabetes and patient agrees:   -He is doing better with lower dose of insulin, lost 6 pounds since last visit.  -I  approached him to stay committed on lifestyle modification and readjusted her treatment as follows:  -I will continue Levemir at 80 units nightly, discussed and increased his Humalog to 22 units 3 times daily AC for pre-meal blood glucose  of 90-150mg /dl, plus patient specific correction dose for unexpected hyperglycemia above 150mg /dl, associated with strict monitoring of  glucose 4 times a day-before meals and at bedtime. - Patient is warned not to take insulin without proper monitoring per orders. -Adjustment parameters are given for hypo and hyperglycemia in writing. -Patient is encouraged to call clinic for blood glucose levels less than 70 or above 300 mg /dl. - I will continue metformin 1000 mg p.o. twice daily, therapeutically suitable for patient . -She is advised to continue Bydureon 2 mg subcutaneously weekly.   - Patient specific target  A1c;  LDL, HDL, Triglycerides, and  Waist Circumference were discussed in detail.  2) BP/HTN: His blood pressure is controlled to target.    He is advised to continue current medications including losartan 100 mg p.o. Daily.  3) Lipids/HPL:   No recent lipid panel to review.  He is on atorvastatin 40 mg p.o. nightly, advised to continue with the same.  He will be considered for fasting lipid panel before his next visit.  4)  Weight/Diet: CDE Consult will be initiated , exercise, and detailed carbohydrates information provided.  5) Chronic Care/Health Maintenance:  -he  is on ACEI/ARB and Statin medications and  is encouraged to continue to follow up with Ophthalmology, Dentist,  Podiatrist at least yearly or according to recommendations, and advised to  stay away from smoking. I have recommended yearly flu vaccine and pneumonia vaccination at least every 5 years; moderate intensity exercise for up to 150 minutes weekly; and  sleep for at least 7 hours a day.  - I advised patient to maintain close follow up with Dairl Ponder, MD for primary care  needs.  - Time spent with the patient: 25 min, of which >50% was spent in reviewing his blood glucose logs , discussing his hypo- and hyper-glycemic episodes, reviewing his current and  previous labs and insulin doses and developing a plan to avoid hypo- and hyper-glycemia. Please refer to Patient Instructions for Blood Glucose Monitoring and Insulin/Medications Dosing Guide"  in media tab for additional information. Todd Carey participated in the discussions, expressed understanding, and voiced agreement with the above plans.  All questions were answered to his satisfaction. he is encouraged to contact clinic should he have any questions or concerns prior to his return visit.   Follow up plan: - Return in about 5 weeks (around 08/27/2017) for follow up with pre-visit labs, meter, and logs.  Glade Lloyd, MD [facility] [address] [place], [place] [address] Phone: [phone]  Fax: [phone]    07/23/2017, 5:36 PM  This note was partially dictated with voice recognition software. Similar sounding words can be transcribed inadequately or may not  be corrected upon review.

## 2017-07-23 NOTE — Patient Instructions (Signed)

## 2017-07-30 DEATH — deceased

## 2017-08-20 LAB — TSH: TSH: 1.07 (ref <=5.90)

## 2017-08-20 LAB — VITAMIN D 25 HYDROXY (VIT D DEFICIENCY, FRACTURES): Vit D, 25-Hydroxy: 39

## 2017-08-20 LAB — LIPID PANEL
CHOLESTEROL: 80 (ref 0–200)
HDL: 37 (ref 35–70)
LDL CALC: 24
Triglycerides: 103 (ref 40–160)

## 2017-08-20 LAB — BASIC METABOLIC PANEL
BUN: 12 (ref 4–21)
Creatinine: 1.1 (ref <=1.3)

## 2017-08-20 LAB — HEMOGLOBIN A1C: Hemoglobin A1C: 8.6

## 2017-08-27 ENCOUNTER — Encounter: Payer: Self-pay | Admitting: "Endocrinology

## 2017-08-27 ENCOUNTER — Ambulatory Visit: Payer: 59 | Admitting: "Endocrinology

## 2017-08-27 VITALS — BP 149/84 | HR 64 | Ht 70.0 in | Wt 224.2 lb

## 2017-08-27 DIAGNOSIS — E66811 Obesity, class 1: Secondary | ICD-10-CM

## 2017-08-27 DIAGNOSIS — E1165 Type 2 diabetes mellitus with hyperglycemia: Secondary | ICD-10-CM | POA: Diagnosis not present

## 2017-08-27 DIAGNOSIS — E6609 Other obesity due to excess calories: Secondary | ICD-10-CM

## 2017-08-27 DIAGNOSIS — E782 Mixed hyperlipidemia: Secondary | ICD-10-CM

## 2017-08-27 DIAGNOSIS — Z6834 Body mass index (BMI) 34.0-34.9, adult: Secondary | ICD-10-CM

## 2017-08-27 DIAGNOSIS — I10 Essential (primary) hypertension: Secondary | ICD-10-CM

## 2017-08-27 MED ORDER — ATORVASTATIN CALCIUM 20 MG PO TABS: 20.0000 mg | ORAL_TABLET | Freq: Every day | ORAL | 6 refills | Status: DC

## 2017-08-27 NOTE — Patient Instructions (Signed)

## 2017-08-27 NOTE — Progress Notes (Signed)
Endocrinology follow-up note       08/27/2017, 5:31 PM   Subjective:    Patient ID: Todd Carey, male    DOB: 1955-12-07.  Todd Carey is being seen in follow-up for management of currently uncontrolled symptomatic diabetes requested by  Dairl Ponder, MD.   Past Medical History:  Diagnosis Date  . Arthritis    Right shoulder   . Diabetes mellitus   . Hypercholesteremia    Past Surgical History:  Procedure Laterality Date  . COLONOSCOPY    . COLONOSCOPY N/A 02/08/2016   Procedure: COLONOSCOPY;  Surgeon: Danie Binder, MD;  Location: [facility];  Service: Endoscopy;  Laterality: N/A;  10:00 Am   Social History   Socioeconomic History  . Marital status: Married    Spouse name: Not on file  . Number of children: Not on file  . Years of education: Not on file  . Highest education level: Not on file  Occupational History  . Not on file  Social Needs  . Financial resource strain: Not on file  . Food insecurity:    Worry: Not on file    Inability: Not on file  . Transportation needs:    Medical: Not on file    Non-medical: Not on file  Tobacco Use  . Smoking status: Former Smoker    Packs/day: 1.00    Years: 38.00    Pack years: 38.00    Types: Cigarettes    Last attempt to quit: 04/18/2013    Years since quitting: 4.3  . Smokeless tobacco: Former Network engineer and Sexual Activity  . Alcohol use: No  . Drug use: No  . Sexual activity: Not on file  Lifestyle  . Physical activity:    Days per week: Not on file    Minutes per session: Not on file  . Stress: Not on file  Relationships  . Social connections:    Talks on phone: Not on file    Gets together: Not on file    Attends religious service: Not on file    Active member of club or organization: Not on file    Attends meetings of clubs or organizations: Not on file    Relationship status: Not on file  Other  Topics Concern  . Not on file  Social History Narrative  . Not on file   Outpatient Encounter Medications as of 08/27/2017  Medication Sig  . aspirin 81 MG tablet Take 81 mg by mouth daily.    Marland Kitchen atorvastatin (LIPITOR) 40 MG tablet Take 40 mg by mouth daily.  . Cholecalciferol (VITAMIN D) 2000 units tablet Take 2,000 Units by mouth daily.  . Exenatide ER 2 MG/0.85ML AUIJ Inject into the skin once a week.  . fish oil-omega-3 fatty acids 1000 MG capsule Take 3 g by mouth daily.    . Insulin Detemir (LEVEMIR FLEXTOUCH) 100 UNIT/ML Pen Inject 80 Units into the skin at bedtime.   . insulin lispro (HUMALOG KWIKPEN) 100 UNIT/ML KiwkPen Inject 25-31 Units into the skin 3 (three) times daily before meals.  Marland Kitchen losartan (COZAAR) 100 MG tablet Take 100 mg  by mouth daily.  . metFORMIN (GLUCOPHAGE) 1000 MG tablet Take 1,000 mg by mouth 2 (two) times daily with a meal.     No facility-administered encounter medications on file as of 08/27/2017.     ALLERGIES: No Known Allergies  VACCINATION STATUS:  There is no immunization history on file for this patient.  Diabetes  He presents for his follow-up diabetic visit. He has type 2 diabetes mellitus. Onset time: He was diagnosed at approximate age of 43 years. His disease course has been worsening. There are no hypoglycemic associated symptoms. Pertinent negatives for hypoglycemia include no confusion, headaches, pallor or seizures. Associated symptoms include blurred vision, polydipsia and polyuria. Pertinent negatives for diabetes include no chest pain, no fatigue, no polyphagia and no weakness. There are no hypoglycemic complications. Symptoms are improving. Risk factors for coronary artery disease include diabetes mellitus, dyslipidemia, family history, male sex, hypertension, obesity, sedentary lifestyle and tobacco exposure. Current diabetic treatments: He is currently on Levemir 80 units daily, Humalog 15 units 3 times daily, Bydureon 2 mg weekly,  metformin 1000 mg twice a day. His weight is decreasing steadily. He is following a generally unhealthy diet. When asked about meal planning, he reported none. He has not had a previous visit with a dietitian. He rarely participates in exercise. His home blood glucose trend is fluctuating minimally. His breakfast blood glucose range is generally 180-200 mg/dl. His lunch blood glucose range is generally 180-200 mg/dl. His dinner blood glucose range is generally 180-200 mg/dl. His bedtime blood glucose range is generally >200 mg/dl. His overall blood glucose range is >200 mg/dl. ( His most recent A1c was 9.3% on April 23, 2017.) An ACE inhibitor/angiotensin II receptor blocker is being taken.  Hyperlipidemia  This is a chronic problem. The current episode started more than 1 year ago. The problem is uncontrolled. Exacerbating diseases include diabetes and obesity. Pertinent negatives include no chest pain, myalgias or shortness of breath. Current antihyperlipidemic treatment includes statins. Risk factors for coronary artery disease include dyslipidemia, diabetes mellitus, family history, male sex, obesity, hypertension and a sedentary lifestyle.  Hypertension  This is a chronic problem. The current episode started more than 1 year ago. The problem is uncontrolled. Associated symptoms include blurred vision. Pertinent negatives include no chest pain, headaches, neck pain, palpitations or shortness of breath. Risk factors for coronary artery disease include dyslipidemia, diabetes mellitus, obesity, male gender, smoking/tobacco exposure and sedentary lifestyle. Past treatments include angiotensin blockers.      Review of Systems  Constitutional: Negative for chills, fatigue, fever and unexpected weight change.  HENT: Negative for dental problem, mouth sores and trouble swallowing.   Eyes: Positive for blurred vision. Negative for visual disturbance.  Respiratory: Negative for cough, choking, chest  tightness, shortness of breath and wheezing.   Cardiovascular: Negative for chest pain, palpitations and leg swelling.  Gastrointestinal: Negative for abdominal distention, abdominal pain, constipation, diarrhea, nausea and vomiting.  Endocrine: Positive for polydipsia and polyuria. Negative for polyphagia.  Genitourinary: Negative for dysuria, flank pain, hematuria and urgency.  Musculoskeletal: Negative for back pain, gait problem, myalgias and neck pain.  Skin: Negative for pallor, rash and wound.  Neurological: Negative for seizures, syncope, weakness, numbness and headaches.  Psychiatric/Behavioral: Negative.  Negative for confusion and dysphoric mood.    Objective:    BP (!) 149/84 (BP Location: Left Arm, Patient Position: Sitting)   Pulse 64   Ht 5\' 10"  (1.778 m)   Wt 224 lb 3.2 oz (101.7 kg)   SpO2 96%  BMI 32.17 kg/m   Wt Readings from Last 3 Encounters:  08/27/17 224 lb 3.2 oz (101.7 kg)  07/23/17 231 lb (104.8 kg)  07/09/17 237 lb (107.5 kg)     Physical Exam  Constitutional: He is oriented to person, place, and time. He appears well-developed. He is cooperative. No distress.  HENT:  Head: Normocephalic and atraumatic.  Eyes: EOM are normal.  Neck: Normal range of motion. Neck supple. No tracheal deviation present. No thyromegaly present.  Cardiovascular: Normal rate, S1 normal and S2 normal. Exam reveals no gallop.  No murmur heard. Pulses:      Dorsalis pedis pulses are 1+ on the right side, and 1+ on the left side.       Posterior tibial pulses are 1+ on the right side, and 1+ on the left side.  Pulmonary/Chest: Effort normal. No respiratory distress. He has no wheezes.  Abdominal: He exhibits no distension. There is no tenderness. There is no guarding and no CVA tenderness.  Musculoskeletal: He exhibits no edema.       Right shoulder: He exhibits no swelling and no deformity.  Neurological: He is alert and oriented to person, place, and time. He has normal  strength and normal reflexes. No cranial nerve deficit or sensory deficit. Gait normal.  Skin: Skin is warm and dry. No rash noted. No cyanosis. Nails show no clubbing.  Psychiatric: He has a normal mood and affect. His speech is normal. Judgment normal. Cognition and memory are normal.   Recent Results (from the past 2160 hour(s))  VITAMIN D 25 Hydroxy (Vit-D Deficiency, Fractures)     Status: None   Collection Time: 08/20/17 12:00 AM  Result Value Ref Range   Vit D, 25-Hydroxy 39   Basic metabolic panel     Status: None   Collection Time: 08/20/17 12:00 AM  Result Value Ref Range   BUN 12 4 - 21   Creatinine 1.1 0.6 - 1.3  Lipid panel     Status: None   Collection Time: 08/20/17 12:00 AM  Result Value Ref Range   Triglycerides 103 40 - 160   Cholesterol 80 0 - 200   HDL 37 35 - 70   LDL Cholesterol 24   Hemoglobin A1c     Status: None   Collection Time: 08/20/17 12:00 AM  Result Value Ref Range   Hemoglobin A1C 8.6   TSH     Status: None   Collection Time: 08/20/17 12:00 AM  Result Value Ref Range   TSH 1.07 0.41 - 5.90    Comment: free t4 0.9    April 23, 2017 labs: BUN 14, creatinine 1.0, A1c 9.3%  Assessment & Plan:   1. Uncontrolled type 2 diabetes mellitus with hyperglycemia (Todd Carey)  - Todd Carey [facility] has currently uncontrolled symptomatic type 2 DM since 62 years of age. -Returns with improved glycemic profile, still above target.  His most recent labs show A1c of 8.6% improving from 9.3%.   - Recent labs reviewed.  -his diabetes is complicated by obesity/sedentary life, and Todd Carey remains at a high risk for more acute and chronic complications which include CAD, CVA, CKD, retinopathy, and neuropathy. These are all discussed in detail with the patient.  - I have counseled him on diet management and weight loss, by adopting a carbohydrate restricted/protein rich diet.  -  Suggestion is made for him to avoid simple carbohydrates  from his diet including  Cakes, Sweet Desserts / Pastries, Ice Cream, Soda (diet  and regular), Sweet Tea, Candies, Chips, Cookies, Store Bought Juices, Alcohol in Excess of  1-2 drinks a day, Artificial Sweeteners, and "Sugar-free" Products. This will help patient to have stable blood glucose profile and potentially avoid unintended weight gain.  - I encouraged him to switch to  unprocessed or minimally processed complex starch and increased protein intake (animal or plant source), fruits, and vegetables.  - he is advised to stick to a routine mealtimes to eat 3 meals  a day and avoid unnecessary snacks ( to snack only to correct hypoglycemia).   -He follows with a dietitian in [place].  - I have approached him with the following individualized plan to manage diabetes and patient agrees:   -He is doing better with lower dose of insulin, lost 15 pounds overall.   -I approached him to stay committed on lifestyle modification and readjusted her treatment as follows:  -I will continue Levemir at 80 units nightly, discussed and increased his Humalog to 25 units 3 times daily AC for pre-meal blood glucose  of 90-150mg /dl, plus patient specific correction dose for unexpected hyperglycemia above 150mg /dl, associated with strict monitoring of glucose 4 times a day-before meals and at bedtime. - Patient is warned not to take insulin without proper monitoring per orders. -Adjustment parameters are given for hypo and hyperglycemia in writing. -Patient is encouraged to call clinic for blood glucose levels less than 70 or above 300 mg /dl. - I will continue metformin 1000 mg p.o. twice daily, therapeutically suitable for patient . -She is advised to continue Bydureon 2 mg subcutaneously weekly.   - Patient specific target  A1c;  LDL, HDL, Triglycerides, and  Waist Circumference were discussed in detail.  2) BP/HTN: His blood pressure is not controlled to target.  He is advised to continue his current blood pressure medications  including losartan 100 mg p.o. Daily.  3) Lipids/HPL:   His recent lipid panel shows controlled LDL at 24.  I will proceed to lower his atorvastatin to 20 mg nightly.    4)  Weight/Diet: CDE Consult will be initiated , exercise, and detailed carbohydrates information provided.  5) Chronic Care/Health Maintenance:  -he  is on ACEI/ARB and Statin medications and  is encouraged to continue to follow up with Ophthalmology, Dentist,  Podiatrist at least yearly or according to recommendations, and advised to  stay away from smoking. I have recommended yearly flu vaccine and pneumonia vaccination at least every 5 years; moderate intensity exercise for up to 150 minutes weekly; and  sleep for at least 7 hours a day.  - I advised patient to maintain close follow up with Dairl Ponder, MD for primary care needs.  - Time spent with the patient: 25 min, of which >50% was spent in reviewing his blood glucose logs , discussing his hypo- and hyper-glycemic episodes, reviewing his current and  previous labs and insulin doses and developing a plan to avoid hypo- and hyper-glycemia. Please refer to Patient Instructions for Blood Glucose Monitoring and Insulin/Medications Dosing Guide"  in media tab for additional information. Todd Carey participated in the discussions, expressed understanding, and voiced agreement with the above plans.  All questions were answered to his satisfaction. he is encouraged to contact clinic should he have any questions or concerns prior to his return visit.   Follow up plan: - Return in about 3 months (around 11/27/2017) for follow up with pre-visit labs, meter, and logs.  Todd Lloyd, MD [facility] [phone]  [address] [place], [place] [address] Phone: [phone]  Fax: [phone]    08/27/2017, 5:31 PM  This note was partially dictated with voice recognition software. Similar sounding words can be transcribed  inadequately or may not  be corrected upon review.

## 2017-09-04 ENCOUNTER — Ambulatory Visit: Payer: 59 | Admitting: "Endocrinology

## 2017-11-26 LAB — LIPID PANEL
CHOLESTEROL: 95 (ref 0–200)
HDL: 37 (ref 35–70)
LDL Cholesterol: 40
TRIGLYCERIDES: 94 (ref 40–160)

## 2017-11-26 LAB — HEMOGLOBIN A1C: Hemoglobin A1C: 7.5

## 2017-11-26 LAB — BASIC METABOLIC PANEL
BUN: 14 (ref 4–21)
CREATININE: 1 (ref 0.6–1.3)

## 2017-11-27 ENCOUNTER — Ambulatory Visit: Payer: 59 | Admitting: "Endocrinology

## 2017-12-04 ENCOUNTER — Encounter: Payer: Self-pay | Admitting: "Endocrinology

## 2017-12-04 ENCOUNTER — Ambulatory Visit (INDEPENDENT_AMBULATORY_CARE_PROVIDER_SITE_OTHER): Payer: 59 | Admitting: "Endocrinology

## 2017-12-04 VITALS — BP 127/82 | HR 74 | Ht 70.0 in | Wt 224.0 lb

## 2017-12-04 DIAGNOSIS — E6609 Other obesity due to excess calories: Secondary | ICD-10-CM | POA: Diagnosis not present

## 2017-12-04 DIAGNOSIS — Z6834 Body mass index (BMI) 34.0-34.9, adult: Secondary | ICD-10-CM

## 2017-12-04 DIAGNOSIS — E1165 Type 2 diabetes mellitus with hyperglycemia: Secondary | ICD-10-CM

## 2017-12-04 DIAGNOSIS — I10 Essential (primary) hypertension: Secondary | ICD-10-CM | POA: Diagnosis not present

## 2017-12-04 DIAGNOSIS — E782 Mixed hyperlipidemia: Secondary | ICD-10-CM | POA: Diagnosis not present

## 2017-12-04 NOTE — Progress Notes (Signed)
Endocrinology follow-up note       12/04/2017, 3:34 PM   Subjective:    Patient ID: Todd Carey, male    DOB: 21-Oct-1955.  ARIANA CAVENAUGH is being seen in follow-up for management of currently uncontrolled symptomatic type 2 diabetes, hyperlipidemia, hypertension. PMD:  Dairl Ponder, MD.   Past Medical History:  Diagnosis Date  . Arthritis    Right shoulder   . Diabetes mellitus   . Hypercholesteremia    Past Surgical History:  Procedure Laterality Date  . COLONOSCOPY    . COLONOSCOPY N/A 02/08/2016   Procedure: COLONOSCOPY;  Surgeon: Danie Binder, MD;  Location: [facility];  Service: Endoscopy;  Laterality: N/A;  10:00 Am   Social History   Socioeconomic History  . Marital status: Married    Spouse name: Not on file  . Number of children: Not on file  . Years of education: Not on file  . Highest education level: Not on file  Occupational History  . Not on file  Social Needs  . Financial resource strain: Not on file  . Food insecurity:    Worry: Not on file    Inability: Not on file  . Transportation needs:    Medical: Not on file    Non-medical: Not on file  Tobacco Use  . Smoking status: Former Smoker    Packs/day: 1.00    Years: 38.00    Pack years: 38.00    Types: Cigarettes    Last attempt to quit: 04/18/2013    Years since quitting: 4.6  . Smokeless tobacco: Former Network engineer and Sexual Activity  . Alcohol use: No  . Drug use: No  . Sexual activity: Not on file  Lifestyle  . Physical activity:    Days per week: Not on file    Minutes per session: Not on file  . Stress: Not on file  Relationships  . Social connections:    Talks on phone: Not on file    Gets together: Not on file    Attends religious service: Not on file    Active member of club or organization: Not on file    Attends meetings of clubs or organizations: Not on file    Relationship  status: Not on file  Other Topics Concern  . Not on file  Social History Narrative  . Not on file   Outpatient Encounter Medications as of 12/04/2017  Medication Sig  . atorvastatin (LIPITOR) 40 MG tablet Take 40 mg by mouth daily.  Marland Kitchen aspirin 81 MG tablet Take 81 mg by mouth daily.    . Cholecalciferol (VITAMIN D) 2000 units tablet Take 2,000 Units by mouth daily.  . Exenatide ER 2 MG/0.85ML AUIJ Inject into the skin once a week.  . fish oil-omega-3 fatty acids 1000 MG capsule Take 3 g by mouth daily.    . Insulin Detemir (LEVEMIR FLEXTOUCH) 100 UNIT/ML Pen Inject 80 Units into the skin at bedtime.   . insulin lispro (HUMALOG KWIKPEN) 100 UNIT/ML KiwkPen Inject 25-31 Units into the skin 3 (three) times daily before meals.  Marland Kitchen losartan (COZAAR) 100 MG tablet  Take 100 mg by mouth daily.  . metFORMIN (GLUCOPHAGE) 1000 MG tablet Take 1,000 mg by mouth 2 (two) times daily with a meal.    . [DISCONTINUED] atorvastatin (LIPITOR) 20 MG tablet Take 1 tablet (20 mg total) by mouth daily.   No facility-administered encounter medications on file as of 12/04/2017.     ALLERGIES: No Known Allergies  VACCINATION STATUS:  There is no immunization history on file for this patient.  Diabetes  He presents for his follow-up diabetic visit. He has type 2 diabetes mellitus. Onset time: He was diagnosed at approximate age of 29 years. His disease course has been improving. There are no hypoglycemic associated symptoms. Pertinent negatives for hypoglycemia include no confusion, headaches, pallor or seizures. Pertinent negatives for diabetes include no blurred vision, no chest pain, no fatigue, no polydipsia, no polyphagia, no polyuria and no weakness. There are no hypoglycemic complications. Symptoms are improving. Risk factors for coronary artery disease include diabetes mellitus, dyslipidemia, family history, male sex, hypertension, obesity, sedentary lifestyle and tobacco exposure. Current diabetic treatments:  He is currently on Levemir 80 units daily, Humalog 15 units 3 times daily, Bydureon 2 mg weekly, metformin 1000 mg twice a day. His weight is stable. He is following a generally unhealthy diet. When asked about meal planning, he reported none. He has not had a previous visit with a dietitian. He rarely participates in exercise. His home blood glucose trend is fluctuating minimally. His breakfast blood glucose range is generally >200 mg/dl. His lunch blood glucose range is generally 180-200 mg/dl. His dinner blood glucose range is generally 180-200 mg/dl. His bedtime blood glucose range is generally 180-200 mg/dl. His overall blood glucose range is 180-200 mg/dl. An ACE inhibitor/angiotensin II receptor blocker is being taken.  Hyperlipidemia  This is a chronic problem. The current episode started more than 1 year ago. The problem is uncontrolled. Exacerbating diseases include diabetes and obesity. Pertinent negatives include no chest pain, myalgias or shortness of breath. Current antihyperlipidemic treatment includes statins. Risk factors for coronary artery disease include dyslipidemia, diabetes mellitus, family history, male sex, obesity, hypertension and a sedentary lifestyle.  Hypertension  This is a chronic problem. The current episode started more than 1 year ago. The problem is uncontrolled. Pertinent negatives include no blurred vision, chest pain, headaches, neck pain, palpitations or shortness of breath. Risk factors for coronary artery disease include dyslipidemia, diabetes mellitus, obesity, male gender, smoking/tobacco exposure and sedentary lifestyle. Past treatments include angiotensin blockers.    Review of Systems  Constitutional: Negative for chills, fatigue, fever and unexpected weight change.  HENT: Negative for dental problem, mouth sores and trouble swallowing.   Eyes: Negative for blurred vision and visual disturbance.  Respiratory: Negative for cough, choking, chest tightness,  shortness of breath and wheezing.   Cardiovascular: Negative for chest pain, palpitations and leg swelling.  Gastrointestinal: Negative for abdominal distention, abdominal pain, constipation, diarrhea, nausea and vomiting.  Endocrine: Negative for polydipsia, polyphagia and polyuria.  Genitourinary: Negative for dysuria, flank pain, hematuria and urgency.  Musculoskeletal: Negative for back pain, gait problem, myalgias and neck pain.  Skin: Negative for pallor, rash and wound.  Neurological: Negative for seizures, syncope, weakness, numbness and headaches.  Psychiatric/Behavioral: Negative.  Negative for confusion and dysphoric mood.    Objective:    BP 127/82   Pulse 74   Ht 5\' 10"  (1.778 m)   Wt 224 lb (101.6 kg)   BMI 32.14 kg/m   Wt Readings from Last 3 Encounters:  12/04/17  224 lb (101.6 kg)  08/27/17 224 lb 3.2 oz (101.7 kg)  07/23/17 231 lb (104.8 kg)     Physical Exam  Constitutional: He is oriented to person, place, and time. He appears well-developed. He is cooperative. No distress.  HENT:  Head: Normocephalic and atraumatic.  Eyes: EOM are normal.  Neck: Normal range of motion. Neck supple. No tracheal deviation present. No thyromegaly present.  Cardiovascular: Normal rate, S1 normal and S2 normal. Exam reveals no gallop.  No murmur heard. Pulses:      Dorsalis pedis pulses are 1+ on the right side, and 1+ on the left side.       Posterior tibial pulses are 1+ on the right side, and 1+ on the left side.  Pulmonary/Chest: Effort normal. No respiratory distress. He has no wheezes.  Abdominal: He exhibits no distension. There is no tenderness. There is no guarding and no CVA tenderness.  Musculoskeletal: He exhibits no edema.       Right shoulder: He exhibits no swelling and no deformity.  Neurological: He is alert and oriented to person, place, and time. He has normal strength and normal reflexes. No cranial nerve deficit or sensory deficit. Gait normal.  Skin: Skin  is warm and dry. No rash noted. No cyanosis. Nails show no clubbing.  Psychiatric: He has a normal mood and affect. His speech is normal. Judgment normal. Cognition and memory are normal.   Recent Results (from the past 2160 hour(s))  Basic metabolic panel     Status: None   Collection Time: 11/26/17 12:00 AM  Result Value Ref Range   BUN 14 4 - 21   Creatinine 1.0 0.6 - 1.3  Lipid panel     Status: None   Collection Time: 11/26/17 12:00 AM  Result Value Ref Range   Triglycerides 94 40 - 160   Cholesterol 95 0 - 200   HDL 37 35 - 70   LDL Cholesterol 40   Hemoglobin A1c     Status: None   Collection Time: 11/26/17 12:00 AM  Result Value Ref Range   Hemoglobin A1C 7.5     April 23, 2017 labs: BUN 14, creatinine 1.0, A1c 9.3%  Assessment & Plan:   1. Uncontrolled type 2 diabetes mellitus with hyperglycemia ([place])  - Todd Carey has currently uncontrolled symptomatic type 2 DM since 62 years of age. -He returns with improving glycemic profile, and A1c of 7.5%, slowly improving from 9.3%.    - Recent labs reviewed.  -his diabetes is complicated by obesity/sedentary life, and Todd Carey remains at a high risk for more acute and chronic complications which include CAD, CVA, CKD, retinopathy, and neuropathy. These are all discussed in detail with the patient.  - I have counseled him on diet management and weight loss, by adopting a carbohydrate restricted/protein rich diet.  -  Suggestion is made for him to avoid simple carbohydrates  from his diet including Cakes, Sweet Desserts / Pastries, Ice Cream, Soda (diet and regular), Sweet Tea, Candies, Chips, Cookies, Store Bought Juices, Alcohol in Excess of  1-2 drinks a day, Artificial Sweeteners, and "Sugar-free" Products. This will help patient to have stable blood glucose profile and potentially avoid unintended weight gain.   - I encouraged him to switch to  unprocessed or minimally processed complex starch and increased  protein intake (animal or plant source), fruits, and vegetables.  - he is advised to stick to a routine mealtimes to eat 3 meals  a day  and avoid unnecessary snacks ( to snack only to correct hypoglycemia).   -He follows with a dietitian in [place].  - I have approached him with the following individualized plan to manage diabetes and patient agrees:   -He is doing better with lower dose of insulin, lost 15 pounds overall.   -I approached him to stay committed on lifestyle modification and readjusted her treatment as follows:  -He is advised to continue Levemir 80 units nightly,  discussed and to new Humalog 25 units 3 times daily AC for pre-meal blood glucose  of 90-150mg /dl, plus patient specific correction dose for unexpected hyperglycemia above 150mg /dl, associated with strict monitoring of glucose 4 times a day-before meals and at bedtime. - Patient is warned not to take insulin without proper monitoring per orders. -Adjustment parameters are given for hypo and hyperglycemia in writing. -Patient is encouraged to call clinic for blood glucose levels less than 70 or above 300 mg /dl. -He is advised to continue metformin  1000 mg p.o. twice daily, therapeutically suitable for patient . -He is advised to continue Bydureon 2 mg subcutaneously weekly.    - Patient specific target  A1c;  LDL, HDL, Triglycerides, and  Waist Circumference were discussed in detail.  2) BP/HTN: His blood pressure is  controlled to target.    He is advised to continue his current blood pressure medications including losartan 100 mg p.o. Daily.  3) Lipids/HPL:   His recent lipid panel shows controlled LDL at 40.  He is advised to continue atorvastatin 20 mg p.o. Nightly.  4)  Weight/Diet: CDE Consult will be initiated , exercise, and detailed carbohydrates information provided.  5) Chronic Care/Health Maintenance:  -he  is on ACEI/ARB and Statin medications and  is encouraged to continue to follow up with  Ophthalmology, Dentist,  Podiatrist at least yearly or according to recommendations, and advised to  stay away from smoking. I have recommended yearly flu vaccine and pneumonia vaccination at least every 5 years; moderate intensity exercise for up to 150 minutes weekly; and  sleep for at least 7 hours a day.  - I advised patient to maintain close follow up with Dairl Ponder, MD for primary care needs.  - Time spent with the patient: 25 min, of which >50% was spent in reviewing his blood glucose logs , discussing his hypo- and hyper-glycemic episodes, reviewing his current and  previous labs and insulin doses and developing a plan to avoid hypo- and hyper-glycemia. Please refer to Patient Instructions for Blood Glucose Monitoring and Insulin/Medications Dosing Guide"  in media tab for additional information. Todd Carey participated in the discussions, expressed understanding, and voiced agreement with the above plans.  All questions were answered to his satisfaction. he is encouraged to contact clinic should he have any questions or concerns prior to his return visit.    Follow up plan: - Return in about 3 months (around 03/06/2018) for Follow up with Pre-visit Labs, Meter, and Logs.  Glade Lloyd, MD [facility] [address], [place] [address] Phone: [phone]  Fax: [phone]    12/04/2017, 3:34 PM  This note was partially dictated with voice recognition software. Similar sounding words can be transcribed inadequately or may not  be corrected upon review.

## 2017-12-04 NOTE — Patient Instructions (Signed)

## 2018-03-01 LAB — HEMOGLOBIN A1C: Hemoglobin A1C: 8.2

## 2018-03-01 LAB — BASIC METABOLIC PANEL
BUN: 16 (ref 4–21)
Creatinine: 1.2 (ref 0.6–1.3)

## 2018-03-06 ENCOUNTER — Ambulatory Visit: Payer: 59 | Admitting: "Endocrinology

## 2018-03-06 ENCOUNTER — Encounter: Payer: Self-pay | Admitting: "Endocrinology

## 2018-03-06 ENCOUNTER — Other Ambulatory Visit: Payer: Self-pay | Admitting: "Endocrinology

## 2018-03-06 VITALS — BP 122/76 | HR 67 | Ht 70.0 in | Wt 218.0 lb

## 2018-03-06 DIAGNOSIS — E1165 Type 2 diabetes mellitus with hyperglycemia: Secondary | ICD-10-CM

## 2018-03-06 DIAGNOSIS — I10 Essential (primary) hypertension: Secondary | ICD-10-CM | POA: Diagnosis not present

## 2018-03-06 DIAGNOSIS — E559 Vitamin D deficiency, unspecified: Secondary | ICD-10-CM

## 2018-03-06 DIAGNOSIS — E782 Mixed hyperlipidemia: Secondary | ICD-10-CM

## 2018-03-06 MED ORDER — INSULIN DEGLUDEC 200 UNIT/ML ~~LOC~~ SOPN: 90.0000 [IU] | PEN_INJECTOR | Freq: Every day | SUBCUTANEOUS | 2 refills | Status: DC

## 2018-03-06 MED ORDER — FREESTYLE LIBRE 14 DAY SENSOR MISC: 1.0000 | 2 refills | Status: DC

## 2018-03-06 MED ORDER — FREESTYLE LIBRE 14 DAY READER DEVI: 1.0000 | Freq: Once | 0 refills | Status: AC

## 2018-03-06 NOTE — Patient Instructions (Signed)

## 2018-03-06 NOTE — Progress Notes (Signed)
error 

## 2018-03-06 NOTE — Progress Notes (Signed)
Endocrinology follow-up note       03/06/2018, 4:28 PM   Subjective:    Patient ID: Todd Carey, male    DOB: 1955-05-11.  Todd Carey is being seen in follow-up for management of currently uncontrolled symptomatic type 2 diabetes, hyperlipidemia, hypertension. PMD:  Dairl Ponder, MD.   Past Medical History:  Diagnosis Date  . Arthritis    Right shoulder   . Diabetes mellitus   . Hypercholesteremia    Past Surgical History:  Procedure Laterality Date  . COLONOSCOPY    . COLONOSCOPY N/A 02/08/2016   Procedure: COLONOSCOPY;  Surgeon: Danie Binder, MD;  Location: [facility];  Service: Endoscopy;  Laterality: N/A;  10:00 Am   Social History   Socioeconomic History  . Marital status: Married    Spouse name: Not on file  . Number of children: Not on file  . Years of education: Not on file  . Highest education level: Not on file  Occupational History  . Not on file  Social Needs  . Financial resource strain: Not on file  . Food insecurity:    Worry: Not on file    Inability: Not on file  . Transportation needs:    Medical: Not on file    Non-medical: Not on file  Tobacco Use  . Smoking status: Former Smoker    Packs/day: 1.00    Years: 38.00    Pack years: 38.00    Types: Cigarettes    Last attempt to quit: 04/18/2013    Years since quitting: 4.8  . Smokeless tobacco: Former Network engineer and Sexual Activity  . Alcohol use: No  . Drug use: No  . Sexual activity: Not on file  Lifestyle  . Physical activity:    Days per week: Not on file    Minutes per session: Not on file  . Stress: Not on file  Relationships  . Social connections:    Talks on phone: Not on file    Gets together: Not on file    Attends religious service: Not on file    Active member of club or organization: Not on file    Attends meetings of clubs or organizations: Not on file    Relationship  status: Not on file  Other Topics Concern  . Not on file  Social History Narrative  . Not on file   Outpatient Encounter Medications as of 03/06/2018  Medication Sig  . Insulin Degludec (TRESIBA FLEXTOUCH) 200 UNIT/ML SOPN Inject 90 Units into the skin at bedtime.  . [DISCONTINUED] Insulin Degludec (TRESIBA FLEXTOUCH) 200 UNIT/ML SOPN Inject 90 Units into the skin at bedtime.  Marland Kitchen aspirin 81 MG tablet Take 81 mg by mouth daily.    Marland Kitchen atorvastatin (LIPITOR) 40 MG tablet Take 40 mg by mouth daily.  . Cholecalciferol (VITAMIN D) 2000 units tablet Take 2,000 Units by mouth daily.  . Continuous Blood Gluc Receiver (FREESTYLE LIBRE 14 DAY READER) DEVI 1 each by Does not apply route once for 1 dose.  . Continuous Blood Gluc Sensor (FREESTYLE LIBRE 14 DAY SENSOR) MISC Inject 1 each into the skin every  14 (fourteen) days. Use as directed.  . Exenatide ER 2 MG/0.85ML AUIJ Inject into the skin once a week.  . fish oil-omega-3 fatty acids 1000 MG capsule Take 3 g by mouth daily.    . insulin lispro (HUMALOG KWIKPEN) 100 UNIT/ML KiwkPen Inject 25-31 Units into the skin 3 (three) times daily before meals.  Marland Kitchen losartan (COZAAR) 100 MG tablet Take 100 mg by mouth daily.  . metFORMIN (GLUCOPHAGE) 1000 MG tablet Take 1,000 mg by mouth 2 (two) times daily with a meal.    . [DISCONTINUED] Insulin Detemir (LEVEMIR FLEXTOUCH) 100 UNIT/ML Pen Inject 80 Units into the skin at bedtime.    No facility-administered encounter medications on file as of 03/06/2018.     ALLERGIES: No Known Allergies  VACCINATION STATUS:  There is no immunization history on file for this patient.  Diabetes  He presents for his follow-up diabetic visit. He has type 2 diabetes mellitus. Onset time: He was diagnosed at approximate age of 63 years. His disease course has been worsening. There are no hypoglycemic associated symptoms. Pertinent negatives for hypoglycemia include no confusion, headaches, pallor or seizures. Pertinent negatives  for diabetes include no blurred vision, no chest pain, no fatigue, no polydipsia, no polyphagia, no polyuria and no weakness. There are no hypoglycemic complications. Symptoms are worsening. Risk factors for coronary artery disease include diabetes mellitus, dyslipidemia, family history, male sex, hypertension, obesity, sedentary lifestyle and tobacco exposure. Current diabetic treatments: He is currently on Levemir 80 units daily, Humalog 15 units 3 times daily, Bydureon 2 mg weekly, metformin 1000 mg twice a day. His weight is stable. He is following a generally unhealthy diet. When asked about meal planning, he reported none. He has not had a previous visit with a dietitian. He rarely participates in exercise. His home blood glucose trend is fluctuating minimally. His breakfast blood glucose range is generally 180-200 mg/dl. His lunch blood glucose range is generally 180-200 mg/dl. His dinner blood glucose range is generally 180-200 mg/dl. His bedtime blood glucose range is generally 180-200 mg/dl. His overall blood glucose range is 180-200 mg/dl. An ACE inhibitor/angiotensin II receptor blocker is being taken.  Hyperlipidemia  This is a chronic problem. The current episode started more than 1 year ago. The problem is uncontrolled. Exacerbating diseases include diabetes and obesity. Pertinent negatives include no chest pain, myalgias or shortness of breath. Current antihyperlipidemic treatment includes statins. Risk factors for coronary artery disease include dyslipidemia, diabetes mellitus, family history, male sex, obesity, hypertension and a sedentary lifestyle.  Hypertension  This is a chronic problem. The current episode started more than 1 year ago. The problem is uncontrolled. Pertinent negatives include no blurred vision, chest pain, headaches, neck pain, palpitations or shortness of breath. Risk factors for coronary artery disease include dyslipidemia, diabetes mellitus, obesity, male gender,  smoking/tobacco exposure and sedentary lifestyle. Past treatments include angiotensin blockers.    Review of Systems  Constitutional: Negative for chills, fatigue, fever and unexpected weight change.  HENT: Negative for dental problem, mouth sores and trouble swallowing.   Eyes: Negative for blurred vision and visual disturbance.  Respiratory: Negative for cough, choking, chest tightness, shortness of breath and wheezing.   Cardiovascular: Negative for chest pain, palpitations and leg swelling.  Gastrointestinal: Negative for abdominal distention, abdominal pain, constipation, diarrhea, nausea and vomiting.  Endocrine: Negative for polydipsia, polyphagia and polyuria.  Genitourinary: Negative for dysuria, flank pain, hematuria and urgency.  Musculoskeletal: Negative for back pain, gait problem, myalgias and neck pain.  Skin: Negative  for pallor, rash and wound.  Neurological: Negative for seizures, syncope, weakness, numbness and headaches.  Psychiatric/Behavioral: Negative for confusion and dysphoric mood.    Objective:    BP 122/76   Pulse 67   Ht 5\' 10"  (1.778 m)   Wt 218 lb (98.9 kg)   BMI 31.28 kg/m   Wt Readings from Last 3 Encounters:  03/06/18 218 lb (98.9 kg)  12/04/17 224 lb (101.6 kg)  08/27/17 224 lb 3.2 oz (101.7 kg)     Physical Exam Constitutional:      General: He is not in acute distress.    Appearance: He is well-developed.  HENT:     Head: Normocephalic and atraumatic.  Neck:     Musculoskeletal: Normal range of motion and neck supple.     Thyroid: No thyromegaly.     Trachea: No tracheal deviation.  Cardiovascular:     Rate and Rhythm: Normal rate.     Pulses:          Dorsalis pedis pulses are 1+ on the right side and 1+ on the left side.       Posterior tibial pulses are 1+ on the right side and 1+ on the left side.     Heart sounds: S1 normal and S2 normal. No murmur. No gallop.   Pulmonary:     Effort: Pulmonary effort is normal. No respiratory  distress.     Breath sounds: No wheezing.  Abdominal:     General: There is no distension.     Tenderness: There is no abdominal tenderness. There is no guarding.  Musculoskeletal:     Right shoulder: He exhibits no swelling and no deformity.  Skin:    General: Skin is warm and dry.     Findings: No rash.     Nails: There is no clubbing.   Neurological:     Mental Status: He is alert and oriented to person, place, and time.     Cranial Nerves: No cranial nerve deficit.     Sensory: No sensory deficit.     Gait: Gait normal.     Deep Tendon Reflexes: Reflexes are normal and symmetric.  Psychiatric:        Speech: Speech normal.        Behavior: Behavior is cooperative.        Judgment: Judgment normal.    Recent Results (from the past 2160 hour(s))  Basic metabolic panel     Status: None   Collection Time: 03/01/18 12:00 AM  Result Value Ref Range   BUN 16 4 - 21   Creatinine 1.2 0.6 - 1.3  Hemoglobin A1c     Status: None   Collection Time: 03/01/18 12:00 AM  Result Value Ref Range   Hemoglobin A1C 8.2      Assessment & Plan:   1. Uncontrolled type 2 diabetes mellitus with hyperglycemia ([place])  - Mohanad R Gitto has currently uncontrolled symptomatic type 2 DM since 63 years of age. -He returns with increasing fasting glucose profile and A1c of 8.2% increasing from 7.5%.  - Recent labs reviewed.  -his diabetes is complicated by obesity/sedentary life, and ARYON NHAM remains at a high risk for more acute and chronic complications which include CAD, CVA, CKD, retinopathy, and neuropathy. These are all discussed in detail with the patient.  - I have counseled him on diet management and weight loss, by adopting a carbohydrate restricted/protein rich diet.  - Patient admits there is a room for improvement  in his diet and drink choices. -  Suggestion is made for him to avoid simple carbohydrates  from his diet including Cakes, Sweet Desserts / Pastries, Ice Cream, Soda  (diet and regular), Sweet Tea, Candies, Chips, Cookies, Store Bought Juices, Alcohol in Excess of  1-2 drinks a day, Artificial Sweeteners, and "Sugar-free" Products. This will help patient to have stable blood glucose profile and potentially avoid unintended weight gain.   - I encouraged him to switch to  unprocessed or minimally processed complex starch and increased protein intake (animal or plant source), fruits, and vegetables.  - he is advised to stick to a routine mealtimes to eat 3 meals  a day and avoid unnecessary snacks ( to snack only to correct hypoglycemia).   -He follows with a dietitian in [place].  - I have approached him with the following individualized plan to manage diabetes and patient agrees:   -He continues to lose weight.   -He will continue to require basal/bolus insulin in order for him to achieve and maintain control of glucose to target levels.  His insurance is offering coverage for Tresiba instead of Levemir, advised to increase to 90 units of Tresiba nightly, continue Humalog 25 units 3 times daily AC for pre-meal blood glucose  of 90-150mg /dl, plus patient specific correction dose for unexpected hyperglycemia above 150mg /dl, associated with strict monitoring of glucose 4 times a day-before meals and at bedtime. - Patient is warned not to take insulin without proper monitoring per orders. -Adjustment parameters are given for hypo and hyperglycemia in writing. -Patient is encouraged to call clinic for blood glucose levels less than 70 or above 300 mg /dl.  -He would benefit from continuous glucose monitoring device.  I discussed and prescribed the freestyle libre device for him. -He is advised to continue metformin  1000 mg p.o. twice daily, therapeutically suitable for patient .  -He is advised to continue Bydureon 2 mg subcutaneously weekly.    - Patient specific target  A1c;  LDL, HDL, Triglycerides, and  Waist Circumference were discussed in detail.  2)  BP/HTN: His blood pressure is controlled to target.     He is advised to continue his current blood pressure medications including losartan 100 mg p.o. Daily.  3) Lipids/HPL:   His recent lipid panel shows controlled LDL at 40.  He is advised to continue atorvastatin 20 mg p.o. nightly.   4)  Weight/Diet: CDE Consult will be initiated , exercise, and detailed carbohydrates information provided.  5) Chronic Care/Health Maintenance:  -he  is on ACEI/ARB and Statin medications and  is encouraged to continue to follow up with Ophthalmology, Dentist,  Podiatrist at least yearly or according to recommendations, and advised to  stay away from smoking. I have recommended yearly flu vaccine and pneumonia vaccination at least every 5 years; moderate intensity exercise for up to 150 minutes weekly; and  sleep for at least 7 hours a day.  - I advised patient to maintain close follow up with Dairl Ponder, MD for primary care needs.  - Time spent with the patient: 25 min, of which >50% was spent in reviewing his blood glucose logs , discussing his hypoglycemia and hyperglycemia episodes, reviewing his current and  previous labs / studies and medications  doses and developing a plan to avoid hypoglycemia and hyperglycemia. Please refer to Patient Instructions for Blood Glucose Monitoring and Insulin/Medications Dosing Guide"  in media tab for additional information. Rudean Hitt participated in the discussions, expressed understanding, and voiced  agreement with the above plans.  All questions were answered to his satisfaction. he is encouraged to contact clinic should he have any questions or concerns prior to his return visit.   Follow up plan: - Return in about 4 months (around 07/05/2018) for Follow up with Pre-visit Labs, Meter, and Logs.  Glade Lloyd, MD [facility] [address] [place], [place] [address] Phone: [phone]  Fax: [phone]     03/06/2018, 4:28 PM  This note was partially dictated with voice recognition software. Similar sounding words can be transcribed inadequately or may not  be corrected upon review.

## 2018-05-20 ENCOUNTER — Other Ambulatory Visit: Payer: Self-pay | Admitting: "Endocrinology

## 2018-06-28 LAB — TSH: TSH: 0.58 (ref 0.41–5.90)

## 2018-06-28 LAB — VITAMIN D 25 HYDROXY (VIT D DEFICIENCY, FRACTURES): Vit D, 25-Hydroxy: 43

## 2018-06-28 LAB — BASIC METABOLIC PANEL
BUN: 19 (ref 4–21)
Creatinine: 1.2 (ref 0.6–1.3)

## 2018-06-28 LAB — HEMOGLOBIN A1C: Hemoglobin A1C: 8.7

## 2018-06-29 LAB — LIPID PANEL
Cholesterol: 88 (ref 0–200)
HDL: 39 (ref 35–70)
LDL Cholesterol: 32
Triglycerides: 89 (ref 40–160)

## 2018-07-08 ENCOUNTER — Encounter (INDEPENDENT_AMBULATORY_CARE_PROVIDER_SITE_OTHER): Payer: Self-pay

## 2018-07-08 ENCOUNTER — Ambulatory Visit (INDEPENDENT_AMBULATORY_CARE_PROVIDER_SITE_OTHER): Payer: 59 | Admitting: "Endocrinology

## 2018-07-08 ENCOUNTER — Encounter: Payer: Self-pay | Admitting: "Endocrinology

## 2018-07-08 ENCOUNTER — Other Ambulatory Visit: Payer: Self-pay

## 2018-07-08 VITALS — BP 143/75 | HR 91 | Ht 70.0 in | Wt 222.0 lb

## 2018-07-08 DIAGNOSIS — E782 Mixed hyperlipidemia: Secondary | ICD-10-CM

## 2018-07-08 DIAGNOSIS — E1165 Type 2 diabetes mellitus with hyperglycemia: Secondary | ICD-10-CM | POA: Diagnosis not present

## 2018-07-08 DIAGNOSIS — E559 Vitamin D deficiency, unspecified: Secondary | ICD-10-CM | POA: Diagnosis not present

## 2018-07-08 MED ORDER — INSULIN GLARGINE (2 UNIT DIAL) 300 UNIT/ML ~~LOC~~ SOPN: 100.0000 [IU] | PEN_INJECTOR | Freq: Every day | SUBCUTANEOUS | 3 refills | Status: DC

## 2018-07-08 NOTE — Patient Instructions (Signed)

## 2018-07-08 NOTE — Progress Notes (Signed)
Endocrinology follow-up note       07/08/2018, 4:26 PM   Subjective:    Patient ID: Todd Carey, male    DOB: March 20, 1955.  Todd Carey is being seen in follow-up for management of currently uncontrolled symptomatic type 2 diabetes, hyperlipidemia, hypertension. PMD:  Dairl Ponder, MD.   Past Medical History:  Diagnosis Date  . Arthritis    Right shoulder   . Diabetes mellitus   . Hypercholesteremia    Past Surgical History:  Procedure Laterality Date  . COLONOSCOPY    . COLONOSCOPY N/A 02/08/2016   Procedure: COLONOSCOPY;  Surgeon: Danie Binder, MD;  Location: [facility];  Service: Endoscopy;  Laterality: N/A;  10:00 Am   Social History   Socioeconomic History  . Marital status: Married    Spouse name: Not on file  . Number of children: Not on file  . Years of education: Not on file  . Highest education level: Not on file  Occupational History  . Not on file  Social Needs  . Financial resource strain: Not on file  . Food insecurity:    Worry: Not on file    Inability: Not on file  . Transportation needs:    Medical: Not on file    Non-medical: Not on file  Tobacco Use  . Smoking status: Former Smoker    Packs/day: 1.00    Years: 38.00    Pack years: 38.00    Types: Cigarettes    Last attempt to quit: 04/18/2013    Years since quitting: 5.2  . Smokeless tobacco: Former Network engineer and Sexual Activity  . Alcohol use: No  . Drug use: No  . Sexual activity: Not on file  Lifestyle  . Physical activity:    Days per week: Not on file    Minutes per session: Not on file  . Stress: Not on file  Relationships  . Social connections:    Talks on phone: Not on file    Gets together: Not on file    Attends religious service: Not on file    Active member of club or organization: Not on file    Attends meetings of clubs or organizations: Not on file    Relationship  status: Not on file  Other Topics Concern  . Not on file  Social History Narrative  . Not on file   Outpatient Encounter Medications as of 07/08/2018  Medication Sig  . aspirin 81 MG tablet Take 81 mg by mouth daily.    Marland Kitchen atorvastatin (LIPITOR) 40 MG tablet Take 40 mg by mouth daily.  . Cholecalciferol (VITAMIN D) 2000 units tablet Take 2,000 Units by mouth daily.  . Continuous Blood Gluc Sensor (FREESTYLE LIBRE 14 DAY SENSOR) MISC INJECT 1 EACH INTO THE SKIN EVERY 14 (FOURTEEN) DAYS. USE AS DIRECTED.  . fish oil-omega-3 fatty acids 1000 MG capsule Take 3 g by mouth daily.    . Insulin Glargine, 2 Unit Dial, (TOUJEO MAX SOLOSTAR) 300 UNIT/ML SOPN Inject 100 Units into the skin at bedtime.  . insulin lispro (HUMALOG KWIKPEN) 100 UNIT/ML KiwkPen Inject 25-31 Units into the  skin 3 (three) times daily before meals.  Marland Kitchen losartan (COZAAR) 100 MG tablet Take 100 mg by mouth daily.  . metFORMIN (GLUCOPHAGE) 1000 MG tablet Take 1,000 mg by mouth 2 (two) times daily with a meal.    . [DISCONTINUED] Exenatide ER 2 MG/0.85ML AUIJ Inject into the skin once a week.  . [DISCONTINUED] Insulin Degludec (TRESIBA FLEXTOUCH) 200 UNIT/ML SOPN Inject 90 Units into the skin at bedtime.   No facility-administered encounter medications on file as of 07/08/2018.     ALLERGIES: No Known Allergies  VACCINATION STATUS:  There is no immunization history on file for this patient.  Diabetes  He presents for his follow-up diabetic visit. He has type 2 diabetes mellitus. Onset time: He was diagnosed at approximate age of 87 years. His disease course has been worsening. There are no hypoglycemic associated symptoms. Pertinent negatives for hypoglycemia include no confusion, headaches, pallor or seizures. Pertinent negatives for diabetes include no blurred vision, no chest pain, no fatigue, no polydipsia, no polyphagia, no polyuria and no weakness. There are no hypoglycemic complications. Symptoms are worsening. Risk factors  for coronary artery disease include diabetes mellitus, dyslipidemia, family history, male sex, hypertension, obesity, sedentary lifestyle and tobacco exposure. Current diabetic treatments: He is currently on Levemir 80 units daily, Humalog 15 units 3 times daily, Bydureon 2 mg weekly, metformin 1000 mg twice a day. His weight is stable. He is following a generally unhealthy diet. When asked about meal planning, he reported none. He has not had a previous visit with a dietitian. He rarely participates in exercise. His home blood glucose trend is fluctuating minimally. His breakfast blood glucose range is generally 180-200 mg/dl. His lunch blood glucose range is generally 140-180 mg/dl. His dinner blood glucose range is generally 140-180 mg/dl. His bedtime blood glucose range is generally 140-180 mg/dl. His overall blood glucose range is 180-200 mg/dl. An ACE inhibitor/angiotensin II receptor blocker is being taken.  Hyperlipidemia  This is a chronic problem. The current episode started more than 1 year ago. The problem is uncontrolled. Exacerbating diseases include diabetes and obesity. Pertinent negatives include no chest pain, myalgias or shortness of breath. Current antihyperlipidemic treatment includes statins. Risk factors for coronary artery disease include dyslipidemia, diabetes mellitus, family history, male sex, obesity, hypertension and a sedentary lifestyle.  Hypertension  This is a chronic problem. The current episode started more than 1 year ago. The problem is uncontrolled. Pertinent negatives include no blurred vision, chest pain, headaches, neck pain, palpitations or shortness of breath. Risk factors for coronary artery disease include dyslipidemia, diabetes mellitus, obesity, male gender, smoking/tobacco exposure and sedentary lifestyle. Past treatments include angiotensin blockers.    Review of Systems  Constitutional: Negative for chills, fatigue, fever and unexpected weight change.  HENT:  Negative for dental problem, mouth sores and trouble swallowing.   Eyes: Negative for blurred vision and visual disturbance.  Respiratory: Negative for cough, choking, chest tightness, shortness of breath and wheezing.   Cardiovascular: Negative for chest pain, palpitations and leg swelling.  Gastrointestinal: Negative for abdominal distention, abdominal pain, constipation, diarrhea, nausea and vomiting.  Endocrine: Negative for polydipsia, polyphagia and polyuria.  Genitourinary: Negative for dysuria, flank pain, hematuria and urgency.  Musculoskeletal: Negative for back pain, gait problem, myalgias and neck pain.  Skin: Negative for pallor, rash and wound.  Neurological: Negative for seizures, syncope, weakness, numbness and headaches.  Psychiatric/Behavioral: Negative for confusion and dysphoric mood.    Objective:    BP (!) 143/75   Pulse 91  Ht 5\' 10"  (1.778 m)   Wt 222 lb (100.7 kg)   BMI 31.85 kg/m   Wt Readings from Last 3 Encounters:  07/08/18 222 lb (100.7 kg)  03/06/18 218 lb (98.9 kg)  12/04/17 224 lb (101.6 kg)     Physical Exam Constitutional:      General: He is not in acute distress.    Appearance: He is well-developed.  HENT:     Head: Normocephalic and atraumatic.  Neck:     Musculoskeletal: Normal range of motion and neck supple.     Thyroid: No thyromegaly.     Trachea: No tracheal deviation.  Cardiovascular:     Rate and Rhythm: Normal rate.     Pulses:          Dorsalis pedis pulses are 1+ on the right side and 1+ on the left side.       Posterior tibial pulses are 1+ on the right side and 1+ on the left side.     Heart sounds: S1 normal and S2 normal. No murmur. No gallop.   Pulmonary:     Effort: Pulmonary effort is normal. No respiratory distress.     Breath sounds: No wheezing.  Abdominal:     General: There is no distension.     Tenderness: There is no abdominal tenderness. There is no guarding.  Musculoskeletal:     Right shoulder: He  exhibits no swelling and no deformity.  Skin:    General: Skin is warm and dry.     Findings: No rash.     Nails: There is no clubbing.   Neurological:     Mental Status: He is alert and oriented to person, place, and time.     Cranial Nerves: No cranial nerve deficit.     Sensory: No sensory deficit.     Gait: Gait normal.     Deep Tendon Reflexes: Reflexes are normal and symmetric.  Psychiatric:        Speech: Speech normal.        Behavior: Behavior is cooperative.        Judgment: Judgment normal.    Recent Results (from the past 2160 hour(s))  VITAMIN D 25 Hydroxy (Vit-D Deficiency, Fractures)     Status: None   Collection Time: 06/28/18 12:00 AM  Result Value Ref Range   Vit D, 25-Hydroxy 43   Basic metabolic panel     Status: None   Collection Time: 06/28/18 12:00 AM  Result Value Ref Range   BUN 19 4 - 21   Creatinine 1.2 0.6 - 1.3  Lipid panel     Status: None   Collection Time: 06/28/18 12:00 AM  Result Value Ref Range   Triglycerides 89 40 - 160   Cholesterol 88 0 - 200   HDL 39 35 - 70   LDL Cholesterol 32   Hemoglobin A1c     Status: None   Collection Time: 06/28/18 12:00 AM  Result Value Ref Range   Hemoglobin A1C 8.7   TSH     Status: None   Collection Time: 06/28/18 12:00 AM  Result Value Ref Range   TSH 0.58 0.41 - 5.90     Assessment & Plan:   1. Uncontrolled type 2 diabetes mellitus with hyperglycemia ([place])  - Rock R Caban has currently uncontrolled symptomatic type 2 DM since 64 years of age. -He returns with increasing fasting glucose profile and A1c of 8.7% increasing from 7.5%.  - Recent labs reviewed.  -  his diabetes is complicated by obesity/sedentary life, and KONNER SAIZ remains at a high risk for more acute and chronic complications which include CAD, CVA, CKD, retinopathy, and neuropathy. These are all discussed in detail with the patient.  - I have counseled him on diet management and weight loss, by adopting a carbohydrate  restricted/protein rich diet. - he  admits there is a room for improvement in his diet and drink choices. -  Suggestion is made for him to avoid simple carbohydrates  from his diet including Cakes, Sweet Desserts / Pastries, Ice Cream, Soda (diet and regular), Sweet Tea, Candies, Chips, Cookies, Sweet Pastries,  Store Bought Juices, Alcohol in Excess of  1-2 drinks a day, Artificial Sweeteners, Coffee Creamer, and "Sugar-free" Products. This will help patient to have stable blood glucose profile and potentially avoid unintended weight gain.  - I encouraged him to switch to  unprocessed or minimally processed complex starch and increased protein intake (animal or plant source), fruits, and vegetables.  - he is advised to stick to a routine mealtimes to eat 3 meals  a day and avoid unnecessary snacks ( to snack only to correct hypoglycemia).   -He follows with a dietitian in [place].  - I have approached him with the following individualized plan to manage diabetes and patient agrees:    -He will continue to require basal/bolus insulin in order for him to achieve and maintain control of glucose to target levels.  His insurance is offering coverage for [place] instead of [place].   -He is advised to increase his [place] to 100  units of nightly, continue Humalog 25 units 3 times daily AC for pre-meal blood glucose  of 90-150mg /dl, plus patient specific correction dose for unexpected hyperglycemia above 150mg /dl, associated with strict monitoring of glucose 4 times a day-before meals and at bedtime. - Patient is warned not to take insulin without proper monitoring per orders. -Adjustment parameters are given for hypo and hyperglycemia in writing. -Patient is encouraged to call clinic for blood glucose levels less than 70 or above 300 mg /dl.  -He would benefit from continuous glucose monitoring device.  I discussed and prescribed the freestyle libre device for him. -He is advised to continue metformin   1000 mg p.o. twice daily, therapeutically suitable for patient .  -He did not tolerate Bydureon, discontinued.   - Patient specific target  A1c;  LDL, HDL, Triglycerides, and  Waist Circumference were discussed in detail.  2) BP/HTN: His blood pressure is controlled to target.   He is advised to continue his current blood pressure medications include losartan 100 mg p.o. daily at breakfast.    3) Lipids/HPL:   His recent lipid panel shows controlled LDL at 32.  He is advised to continue atorvastatin 20 mg p.o. nightly.    4)  Weight/Diet: CDE Consult will be initiated , exercise, and detailed carbohydrates information provided.  5) Chronic Care/Health Maintenance:  -he  is on ACEI/ARB and Statin medications and  is encouraged to continue to follow up with Ophthalmology, Dentist,  Podiatrist at least yearly or according to recommendations, and advised to  stay away from smoking. I have recommended yearly flu vaccine and pneumonia vaccination at least every 5 years; moderate intensity exercise for up to 150 minutes weekly; and  sleep for at least 7 hours a day.  - I advised patient to maintain close follow up with Dairl Ponder, MD for primary care needs.  - Time spent with the patient: 25 min, of  which >50% was spent in reviewing his blood glucose logs , discussing his hypoglycemia and hyperglycemia episodes, reviewing his current and  previous labs / studies and medications  doses and developing a plan to avoid hypoglycemia and hyperglycemia. Please refer to Patient Instructions for Blood Glucose Monitoring and Insulin/Medications Dosing Guide"  in media tab for additional information. Please  also refer to " Patient Self Inventory" in the Media  tab for reviewed elements of pertinent patient history.  Rudean Hitt participated in the discussions, expressed understanding, and voiced agreement with the above plans.  All questions were answered to his satisfaction. he is encouraged to  contact clinic should he have any questions or concerns prior to his return visit.  Follow up plan: - Return in about 4 months (around 11/07/2018) for Follow up with Pre-visit Labs, Meter, and Logs.  Glade Lloyd, MD [facility] [address] [place], [place] [address] Phone: [phone]  Fax: [phone]    07/08/2018, 4:26 PM  This note was partially dictated with voice recognition software. Similar sounding words can be transcribed inadequately or may not  be corrected upon review.

## 2018-11-04 LAB — BASIC METABOLIC PANEL
BUN: 14 (ref 4–21)
Creatinine: 1 (ref 0.6–1.3)

## 2018-11-04 LAB — HEMOGLOBIN A1C: Hemoglobin A1C: 7.8

## 2018-11-04 LAB — HEPATIC FUNCTION PANEL
ALT: 26 (ref 10–40)
AST: 24 (ref 14–40)

## 2018-11-07 ENCOUNTER — Ambulatory Visit (INDEPENDENT_AMBULATORY_CARE_PROVIDER_SITE_OTHER): Payer: 59 | Admitting: "Endocrinology

## 2018-11-07 ENCOUNTER — Encounter: Payer: Self-pay | Admitting: "Endocrinology

## 2018-11-07 ENCOUNTER — Other Ambulatory Visit: Payer: Self-pay

## 2018-11-07 DIAGNOSIS — E782 Mixed hyperlipidemia: Secondary | ICD-10-CM

## 2018-11-07 DIAGNOSIS — I10 Essential (primary) hypertension: Secondary | ICD-10-CM

## 2018-11-07 DIAGNOSIS — E1165 Type 2 diabetes mellitus with hyperglycemia: Secondary | ICD-10-CM | POA: Diagnosis not present

## 2018-11-07 DIAGNOSIS — E559 Vitamin D deficiency, unspecified: Secondary | ICD-10-CM | POA: Diagnosis not present

## 2018-11-07 NOTE — Progress Notes (Signed)
11/07/2018, 1:44 PM                                                    Endocrinology Telehealth Visit Follow up Note -During COVID -19 Pandemic  This visit type was conducted due to national recommendations for restrictions regarding the COVID-19 Pandemic  in an effort to limit this patient's exposure and mitigate transmission of the corona virus.  Due to his co-morbid illnesses, Todd Carey is at  moderate to high risk for complications without adequate follow up.  This format is felt to be most appropriate for him at this time.  I connected with this patient on 11/07/2018   by telephone and verified that I am speaking with the correct person using two identifiers. Todd Carey, 08-Nov-1955. he has verbally consented to this visit. All issues noted in this document were discussed and addressed. The format was not optimal for physical exam.    Subjective:    Patient ID: Todd Carey, male    DOB: 1955-12-27.  Todd Carey is being engaged in telehealth via telephone in follow-up for management of currently uncontrolled symptomatic type 2 diabetes, hyperlipidemia, hypertension. PMD:  Dairl Ponder, MD.   Past Medical History:  Diagnosis Date  . Arthritis    Right shoulder   . Diabetes mellitus   . Hypercholesteremia    Past Surgical History:  Procedure Laterality Date  . COLONOSCOPY    . COLONOSCOPY N/A 02/08/2016   Procedure: COLONOSCOPY;  Surgeon: Danie Binder, MD;  Location: [facility];  Service: Endoscopy;  Laterality: N/A;  10:00 Am   Social History   Socioeconomic History  . Marital status: Married    Spouse name: Not on file  . Number of children: Not on file  . Years of education: Not on file  . Highest education level: Not on file  Occupational History  . Not on file  Social Needs  . Financial resource strain: Not on file  . Food insecurity    Worry: Not on file    Inability: Not on file  . Transportation needs    Medical:  Not on file    Non-medical: Not on file  Tobacco Use  . Smoking status: Former Smoker    Packs/day: 1.00    Years: 38.00    Pack years: 38.00    Types: Cigarettes    Quit date: 04/18/2013    Years since quitting: 5.5  . Smokeless tobacco: Former Network engineer and Sexual Activity  . Alcohol use: No  . Drug use: No  . Sexual activity: Not on file  Lifestyle  . Physical activity    Days per week: Not on file    Minutes per session: Not on file  . Stress: Not on file  Relationships  . Social Herbalist on phone: Not on file    Gets together: Not on file    Attends religious service: Not on file    Active member of club or organization: Not on file    Attends meetings of clubs or organizations: Not on file    Relationship status: Not on file  Other Topics Concern  . Not on file  Social History Narrative  . Not on file   Outpatient Encounter Medications as of  11/07/2018  Medication Sig  . aspirin 81 MG tablet Take 81 mg by mouth daily.    Marland Kitchen atorvastatin (LIPITOR) 40 MG tablet Take 40 mg by mouth daily.  . Cholecalciferol (VITAMIN D) 2000 units tablet Take 2,000 Units by mouth daily.  . Continuous Blood Gluc Sensor (FREESTYLE LIBRE 14 DAY SENSOR) MISC INJECT 1 EACH INTO THE SKIN EVERY 14 (FOURTEEN) DAYS. USE AS DIRECTED.  . fish oil-omega-3 fatty acids 1000 MG capsule Take 3 g by mouth daily.    . Insulin Glargine, 2 Unit Dial, (TOUJEO MAX SOLOSTAR) 300 UNIT/ML SOPN Inject 100 Units into the skin at bedtime.  . insulin lispro (HUMALOG KWIKPEN) 100 UNIT/ML KiwkPen Inject 25-31 Units into the skin 3 (three) times daily before meals.  Marland Kitchen losartan (COZAAR) 100 MG tablet Take 100 mg by mouth daily.  . metFORMIN (GLUCOPHAGE) 1000 MG tablet Take 1,000 mg by mouth 2 (two) times daily with a meal.     No facility-administered encounter medications on file as of 11/07/2018.     ALLERGIES: No Known Allergies  VACCINATION STATUS:  There is no immunization history on file for  this patient.  Diabetes He presents for his follow-up diabetic visit. He has type 2 diabetes mellitus. Onset time: He was diagnosed at approximate age of 68 years. His disease course has been improving. There are no hypoglycemic associated symptoms. Pertinent negatives for hypoglycemia include no confusion, headaches, pallor or seizures. Pertinent negatives for diabetes include no blurred vision, no chest pain, no fatigue, no polydipsia, no polyphagia, no polyuria and no weakness. There are no hypoglycemic complications. Symptoms are improving. Risk factors for coronary artery disease include diabetes mellitus, dyslipidemia, family history, male sex, hypertension, obesity, sedentary lifestyle and tobacco exposure. Current diabetic treatments: He is currently on Levemir 80 units daily, Humalog 15 units 3 times daily, Bydureon 2 mg weekly, metformin 1000 mg twice a day. He is following a generally unhealthy diet. When asked about meal planning, he reported none. He has not had a previous visit with a dietitian. He rarely participates in exercise. His home blood glucose trend is fluctuating minimally. His breakfast blood glucose range is generally 140-180 mg/dl. His lunch blood glucose range is generally 140-180 mg/dl. His dinner blood glucose range is generally 180-200 mg/dl. His overall blood glucose range is 140-180 mg/dl. An ACE inhibitor/angiotensin II receptor blocker is being taken.  Hyperlipidemia This is a chronic problem. The current episode started more than 1 year ago. The problem is uncontrolled. Exacerbating diseases include diabetes and obesity. Pertinent negatives include no chest pain, myalgias or shortness of breath. Current antihyperlipidemic treatment includes statins. Risk factors for coronary artery disease include dyslipidemia, diabetes mellitus, family history, male sex, obesity, hypertension and a sedentary lifestyle.  Hypertension This is a chronic problem. The current episode started  more than 1 year ago. The problem is uncontrolled. Pertinent negatives include no blurred vision, chest pain, headaches, neck pain, palpitations or shortness of breath. Risk factors for coronary artery disease include dyslipidemia, diabetes mellitus, obesity, male gender, smoking/tobacco exposure and sedentary lifestyle. Past treatments include angiotensin blockers.    Objective:    There were no vitals taken for this visit.  Wt Readings from Last 3 Encounters:  07/08/18 222 lb (100.7 kg)  03/06/18 218 lb (98.9 kg)  12/04/17 224 lb (101.6 kg)      Recent Results (from the past 2160 hour(s))  Basic metabolic panel     Status: None   Collection Time: 11/04/18 12:00 AM  Result Value  Ref Range   BUN 14 4 - 21   Creatinine 1.0 0.6 - 1.3  Hepatic function panel     Status: None   Collection Time: 11/04/18 12:00 AM  Result Value Ref Range   ALT 26 10 - 40   AST 24 14 - 40  Hemoglobin A1c     Status: None   Collection Time: 11/04/18 12:00 AM  Result Value Ref Range   Hemoglobin A1C 7.8      Assessment & Plan:   1. Uncontrolled type 2 diabetes mellitus with hyperglycemia ([place])  - Todd Carey has currently uncontrolled symptomatic type 2 DM since 63 years of age. -He reports improving glycemic profile fasting between 88-163, prelunch between 78-230, presupper between 94-190.  And his A1c is 7.8% improving from 8.7%   - Recent labs reviewed.  -his diabetes is complicated by obesity/sedentary life, and Todd Carey remains at a high risk for more acute and chronic complications which include CAD, CVA, CKD, retinopathy, and neuropathy. These are all discussed in detail with the patient.  - I have counseled him on diet management and weight loss, by adopting a carbohydrate restricted/protein rich diet.  - he  admits there is a room for improvement in his diet and drink choices. -  Suggestion is made for him to avoid simple carbohydrates  from his diet including Cakes, Sweet  Desserts / Pastries, Ice Cream, Soda (diet and regular), Sweet Tea, Candies, Chips, Cookies, Sweet Pastries,  Store Bought Juices, Alcohol in Excess of  1-2 drinks a day, Artificial Sweeteners, Coffee Creamer, and "Sugar-free" Products. This will help patient to have stable blood glucose profile and potentially avoid unintended weight gain.   - I encouraged him to switch to  unprocessed or minimally processed complex starch and increased protein intake (animal or plant source), fruits, and vegetables.  - he is advised to stick to a routine mealtimes to eat 3 meals  a day and avoid unnecessary snacks ( to snack only to correct hypoglycemia).   -He follows with a dietitian in [place].  - I have approached him with the following individualized plan to manage diabetes and patient agrees:    -He will continue to require intensive treatment with basal/bolus insulin in order for him to maintain control of diabetes to target. -He is advised to continue Toujeo 100 units nightly, continue Humalog 25- units 3 times daily AC for pre-meal blood glucose  of 90-150mg /dl, plus patient specific correction dose for unexpected hyperglycemia above 150mg /dl, associated with strict monitoring of glucose 4 times a day-before meals and at bedtime. - Patient is warned not to take insulin without proper monitoring per orders. -Adjustment parameters are given for hypo and hyperglycemia in writing. -Patient is encouraged to call clinic for blood glucose levels less than 70 or above 300 mg /dl.  -He is benefiting from his CGM device, discussed and advised to continue to use his freestyle libre device at all times.    -He is advised to continue metformin 1000 mg p.o. twice a day, therapeutically suitable for patient .  -He did not tolerate Bydureon, discontinued.   - Patient specific target  A1c;  LDL, HDL, Triglycerides, and  Waist Circumference were discussed in detail.  2) BP/HTN: he is advised to home monitor blood  pressure and report if > 140/90 on 2 separate readings.    He is advised to continue his current blood pressure medications include losartan 100 mg p.o. daily at breakfast.    3)  Lipids/HPL:   His recent lipid panel shows controlled LDL at 32.  He is advised to continue atorvastatin 20 mg p.o. nightly.      4)  Weight/Diet: CDE Consult will be initiated , exercise, and detailed carbohydrates information provided.  5) Chronic Care/Health Maintenance:  -he  is on ACEI/ARB and Statin medications and  is encouraged to continue to follow up with Ophthalmology, Dentist,  Podiatrist at least yearly or according to recommendations, and advised to  stay away from smoking. I have recommended yearly flu vaccine and pneumonia vaccination at least every 5 years; moderate intensity exercise for up to 150 minutes weekly; and  sleep for at least 7 hours a day.  - I advised patient to maintain close follow up with Dairl Ponder, MD for primary care needs.  - Patient Care Time Today:  25 min, of which >50% was spent in  counseling and the rest reviewing his  current and  previous labs/studies, previous treatments, his blood glucose readings, and medications' doses and developing a plan for long-term care based on the latest recommendations for standards of care.   Todd Carey participated in the discussions, expressed understanding, and voiced agreement with the above plans.  All questions were answered to his satisfaction. he is encouraged to contact clinic should he have any questions or concerns prior to his return visit.   Follow up plan: - Return in about 4 months (around 03/10/2019) for Bring Meter and Logs- A1c in Office, Include 8 log sheets.  Glade Lloyd, MD [facility] [address] [place], [place] [address] Phone: [phone]  Fax: [phone]    11/07/2018, 1:44 PM  This note was partially dictated with voice recognition software.  Similar sounding words can be transcribed inadequately or may not  be corrected upon review.

## 2019-02-03 ENCOUNTER — Other Ambulatory Visit: Payer: Self-pay | Admitting: "Endocrinology

## 2019-03-10 LAB — BASIC METABOLIC PANEL
BUN: 12 (ref 4–21)
Creatinine: 1 (ref 0.6–1.3)

## 2019-03-10 LAB — LIPID PANEL
Cholesterol: 90 (ref 0–200)
HDL: 37 (ref 35–70)
LDL Cholesterol: 33
Triglycerides: 116 (ref 40–160)

## 2019-03-10 LAB — HEMOGLOBIN A1C: Hemoglobin A1C: 9.5

## 2019-03-11 ENCOUNTER — Ambulatory Visit: Payer: 59 | Admitting: "Endocrinology

## 2019-03-11 ENCOUNTER — Encounter: Payer: Self-pay | Admitting: "Endocrinology

## 2019-03-11 ENCOUNTER — Other Ambulatory Visit: Payer: Self-pay

## 2019-03-11 VITALS — BP 153/81 | HR 73 | Ht 70.0 in | Wt 229.2 lb

## 2019-03-11 DIAGNOSIS — E1165 Type 2 diabetes mellitus with hyperglycemia: Secondary | ICD-10-CM | POA: Diagnosis not present

## 2019-03-11 DIAGNOSIS — I10 Essential (primary) hypertension: Secondary | ICD-10-CM

## 2019-03-11 DIAGNOSIS — E782 Mixed hyperlipidemia: Secondary | ICD-10-CM

## 2019-03-11 DIAGNOSIS — E559 Vitamin D deficiency, unspecified: Secondary | ICD-10-CM | POA: Diagnosis not present

## 2019-03-11 MED ORDER — TOUJEO MAX SOLOSTAR 300 UNIT/ML ~~LOC~~ SOPN: 110.0000 [IU] | PEN_INJECTOR | Freq: Every day | SUBCUTANEOUS | 3 refills | Status: DC

## 2019-03-11 NOTE — Progress Notes (Signed)
03/11/2019, 5:59 PM       Endocrinology follow-up note    Subjective:    Patient ID: Todd Carey, Todd Carey    DOB: 10/06/1955.  Todd Carey is being seen in follow-up for management of currently uncontrolled symptomatic type 2 diabetes, hyperlipidemia, hypertension. PMD:  Dairl Ponder, MD.   Past Medical History:  Diagnosis Date  . Arthritis    Right shoulder   . Diabetes mellitus   . Hypercholesteremia    Past Surgical History:  Procedure Laterality Date  . COLONOSCOPY    . COLONOSCOPY N/A 02/08/2016   Procedure: COLONOSCOPY;  Surgeon: Danie Binder, MD;  Location: [facility];  Service: Endoscopy;  Laterality: N/A;  10:00 Am   Social History   Socioeconomic History  . Marital status: Married    Spouse name: Not on file  . Number of children: Not on file  . Years of education: Not on file  . Highest education level: Not on file  Occupational History  . Not on file  Tobacco Use  . Smoking status: Former Smoker    Packs/day: 1.00    Years: 38.00    Pack years: 38.00    Types: Cigarettes    Quit date: 04/18/2013    Years since quitting: 5.8  . Smokeless tobacco: Former Network engineer and Sexual Activity  . Alcohol use: No  . Drug use: No  . Sexual activity: Not on file  Other Topics Concern  . Not on file  Social History Narrative  . Not on file   Social Determinants of Health   Financial Resource Strain:   . Difficulty of Paying Living Expenses: Not on file  Food Insecurity:   . Worried About Charity fundraiser in the Last Year: Not on file  . Ran Out of Food in the Last Year: Not on file  Transportation Needs:   . Lack of Transportation (Medical): Not on file  . Lack of Transportation (Non-Medical): Not on file  Physical Activity:   . Days of Exercise per Week: Not on file  . Minutes of Exercise per Session: Not on file  Stress:   . Feeling of Stress : Not on file  Social Connections:   . Frequency of  Communication with Friends and Family: Not on file  . Frequency of Social Gatherings with Friends and Family: Not on file  . Attends Religious Services: Not on file  . Active Member of Clubs or Organizations: Not on file  . Attends Archivist Meetings: Not on file  . Marital Status: Not on file   Outpatient Encounter Medications as of 03/11/2019  Medication Sig  . aspirin 81 MG tablet Take 81 mg by mouth daily.    Marland Kitchen atorvastatin (LIPITOR) 40 MG tablet Take 40 mg by mouth daily.  . Cholecalciferol (VITAMIN D) 2000 units tablet Take 2,000 Units by mouth daily.  . Continuous Blood Gluc Sensor (FREESTYLE LIBRE 14 DAY SENSOR) MISC INJECT 1 EACH INTO THE SKIN EVERY 14 (FOURTEEN) DAYS. USE AS DIRECTED.  . fish oil-omega-3 fatty acids 1000 MG capsule Take 3 g by mouth daily.    . Insulin Glargine, 2 Unit Dial, (TOUJEO MAX SOLOSTAR) 300 UNIT/ML SOPN Inject 110 Units into the skin at bedtime.  . insulin lispro (HUMALOG KWIKPEN) 100 UNIT/ML KiwkPen Inject 25-31 Units into the skin 3 (three) times daily before meals.  Marland Kitchen losartan (COZAAR) 100 MG tablet Take 100 mg by mouth daily.  Marland Kitchen  metFORMIN (GLUCOPHAGE) 1000 MG tablet Take 1,000 mg by mouth 2 (two) times daily with a meal.    . [DISCONTINUED] Insulin Glargine, 2 Unit Dial, (TOUJEO MAX SOLOSTAR) 300 UNIT/ML SOPN Inject 100 Units into the skin at bedtime.   No facility-administered encounter medications on file as of 03/11/2019.    ALLERGIES: No Known Allergies  VACCINATION STATUS:  There is no immunization history on file for this patient.  Diabetes He presents for his follow-up diabetic visit. He has type 2 diabetes mellitus. Onset time: He was diagnosed at approximate age of 64 years. His disease course has been worsening. There are no hypoglycemic associated symptoms. Pertinent negatives for hypoglycemia include no confusion, headaches, pallor or seizures. Pertinent negatives for diabetes include no blurred vision, no chest pain, no  fatigue, no polydipsia, no polyphagia, no polyuria and no weakness. There are no hypoglycemic complications. Symptoms are worsening. Risk factors for coronary artery disease include diabetes mellitus, dyslipidemia, family history, Todd Carey sex, hypertension, obesity, sedentary lifestyle and tobacco exposure. Current diabetic treatments: He is currently on Levemir 80 units daily, Humalog 15 units 3 times daily, Bydureon 2 mg weekly, metformin 1000 mg twice a day. His weight is increasing steadily. He is following a generally unhealthy diet. When asked about meal planning, he reported none. He has not had a previous visit with a dietitian. He rarely participates in exercise. His home blood glucose trend is increasing steadily. His breakfast blood glucose range is generally 180-200 mg/dl. His lunch blood glucose range is generally 180-200 mg/dl. His dinner blood glucose range is generally 180-200 mg/dl. His overall blood glucose range is 180-200 mg/dl. An ACE inhibitor/angiotensin II receptor blocker is being taken.  Hyperlipidemia This is a chronic problem. The current episode started more than 1 year ago. The problem is uncontrolled. Exacerbating diseases include diabetes and obesity. Pertinent negatives include no chest pain, myalgias or shortness of breath. Current antihyperlipidemic treatment includes statins. Risk factors for coronary artery disease include dyslipidemia, diabetes mellitus, family history, Todd Carey sex, obesity, hypertension and a sedentary lifestyle.  Hypertension This is a chronic problem. The current episode started more than 1 year ago. The problem is uncontrolled. Pertinent negatives include no blurred vision, chest pain, headaches, neck pain, palpitations or shortness of breath. Risk factors for coronary artery disease include dyslipidemia, diabetes mellitus, obesity, Todd Carey gender, smoking/tobacco exposure and sedentary lifestyle. Past treatments include angiotensin blockers.    Review of  systems  Constitutional: + Minimally fluctuating body weight,  current  Body mass index is 32.89 kg/m. , no fatigue, no subjective hyperthermia, no subjective hypothermia Eyes: no blurry vision, no xerophthalmia ENT: no sore throat, no nodules palpated in throat, no dysphagia/odynophagia, no hoarseness Cardiovascular: no Chest Pain, no Shortness of Breath, no palpitations, no leg swelling Respiratory: no cough, no shortness of breath Gastrointestinal: no Nausea/Vomiting/Diarhhea Musculoskeletal: no muscle/joint aches Skin: no rashes, no hyperemia Neurological: no tremors, no numbness, no tingling, no dizziness Psychiatric: no depression, no anxiety  Objective:    BP (!) 153/81   Pulse 73   Ht 5\' 10"  (1.778 m)   Wt 229 lb 3.2 oz (104 kg)   BMI 32.89 kg/m   Wt Readings from Last 3 Encounters:  03/11/19 229 lb 3.2 oz (104 kg)  07/08/18 222 lb (100.7 kg)  03/06/18 218 lb (98.9 kg)       Physical Exam- Limited  Constitutional:  Body mass index is 32.89 kg/m. , not in acute distress, normal state of mind Eyes:  EOMI, no exophthalmos Neck:  Supple Thyroid: No gross goiter Respiratory: Adequate breathing efforts Musculoskeletal: no gross deformities, strength intact in all four extremities, no gross restriction of joint movements Skin:  no rashes, no hyperemia Neurological: no tremor with outstretched hands,    Recent Results (from the past 2160 hour(s))  Basic metabolic panel     Status: None   Collection Time: 03/10/19 12:00 AM  Result Value Ref Range   BUN 12 4 - 21   Creatinine 1.0 0.6 - 1.3  Lipid panel     Status: None   Collection Time: 03/10/19 12:00 AM  Result Value Ref Range   Triglycerides 116 40 - 160   Cholesterol 90 0 - 200   HDL 37 35 - 70   LDL Cholesterol 33   Hemoglobin A1c     Status: None   Collection Time: 03/10/19 12:00 AM  Result Value Ref Range   Hemoglobin A1C 9.5      Assessment & Plan:   1. Uncontrolled type 2 diabetes mellitus with  hyperglycemia ([place])  - Todd Carey has currently uncontrolled symptomatic type 2 DM since 64 years of age. -He presents with loss of control of glycemia and A1c of 9.5%, increasing from 7.8%.    His CGM analysis for the last 14 days shows time range 59%, 40% above range, average blood glucose of 174.    - Recent labs reviewed.  -his diabetes is complicated by obesity/sedentary life, and Todd Carey remains at a high risk for more acute and chronic complications which include CAD, CVA, CKD, retinopathy, and neuropathy. These are all discussed in detail with the patient.  - I have counseled him on diet management and weight loss, by adopting a carbohydrate restricted/protein rich diet.  - he  admits there is a room for improvement in his diet and drink choices. -  Suggestion is made for him to avoid simple carbohydrates  from his diet including Cakes, Sweet Desserts / Pastries, Ice Cream, Soda (diet and regular), Sweet Tea, Candies, Chips, Cookies, Sweet Pastries,  Store Bought Juices, Alcohol in Excess of  1-2 drinks a day, Artificial Sweeteners, Coffee Creamer, and "Sugar-free" Products. This will help patient to have stable blood glucose profile and potentially avoid unintended weight gain.   - I encouraged him to switch to  unprocessed or minimally processed complex starch and increased protein intake (animal or plant source), fruits, and vegetables.  - he is advised to stick to a routine mealtimes to eat 3 meals  a day and avoid unnecessary snacks ( to snack only to correct hypoglycemia).   -He follows with a dietitian in [place].  - I have approached him with the following individualized plan to manage diabetes and patient agrees:    -He will continue to require intensive treatment with basal/bolus insulin in order for him to maintain control of diabetes to target. -He is advised to increase Toujeo to 110  units nightly, continue Humalog 25- units 3 times daily AC for pre-meal  blood glucose  of 90-150mg /dl, plus patient specific correction dose for unexpected hyperglycemia above 150mg /dl, associated with strict monitoring of glucose 4 times a day-before meals and at bedtime. - Patient is warned not to take insulin without proper monitoring per orders. -Adjustment parameters are given for hypo and hyperglycemia in writing. -Patient is encouraged to call clinic for blood glucose levels less than 70 or above 300 mg /dl.  -He is benefiting from his CGM device,I  discussed and advised to continue to use his  freestyle libre device at all times.    -He is advised to continue Metformin 1000 mg p.o. twice a day , therapeutically suitable for patient .  -He did not tolerate Bydureon, discontinued.   - Patient specific target  A1c;  LDL, HDL, Triglycerides, and  Waist Circumference were discussed in detail.  2) BP/HTN:  His blood pressure is NOT controlled to target.    He is advised to continue his current blood pressure medications include losartan 100 mg p.o. daily at breakfast.    3) Lipids/HPL:   His recent lipid panel shows controlled LDL at 33.  He is advised to continue atorvastatin 20 mg p.o. nightly.        4)  Weight/Diet: His BMI 0000000 complicating the care of his diabetes.  He is a candidate for modest weight loss.  CDE Consult will be initiated , exercise, and detailed carbohydrates information provided.  5) Chronic Care/Health Maintenance:  -he  is on ACEI/ARB and Statin medications and  is encouraged to continue to follow up with Ophthalmology, Dentist,  Podiatrist at least yearly or according to recommendations, and advised to  stay away from smoking. I have recommended yearly flu vaccine and pneumonia vaccination at least every 5 years; moderate intensity exercise for up to 150 minutes weekly; and  sleep for at least 7 hours a day.  - I advised patient to maintain close follow up with Dairl Ponder, MD for primary care needs.  - Time spent on  this patient care encounter:  35 min, of which > 50% was spent in  counseling and the rest reviewing his blood glucose logs , discussing his hypoglycemia and hyperglycemia episodes, reviewing his current and  previous labs / studies  ( including abstraction from other facilities) and medications  doses and developing a  long term treatment plan and documenting his care.   Please refer to Patient Instructions for Blood Glucose Monitoring and Insulin/Medications Dosing Guide"  in media tab for additional information. Please  also refer to " Patient Self Inventory" in the Media  tab for reviewed elements of pertinent patient history.  Todd Carey participated in the discussions, expressed understanding, and voiced agreement with the above plans.  All questions were answered to his satisfaction. he is encouraged to contact clinic should he have any questions or concerns prior to his return visit.    Follow up plan: - Return in about 3 months (around 06/08/2019) for Bring Meter and Logs- A1c in Office.  Glade Lloyd, MD [facility] [address] [place], [place] [address] Phone: [phone]  Fax: [phone]    03/11/2019, 5:59 PM  This note was partially dictated with voice recognition software. Similar sounding words can be transcribed inadequately or may not  be corrected upon review.

## 2019-03-11 NOTE — Patient Instructions (Signed)

## 2019-06-10 ENCOUNTER — Ambulatory Visit: Payer: 59 | Admitting: "Endocrinology

## 2019-06-12 ENCOUNTER — Encounter: Payer: Self-pay | Admitting: "Endocrinology

## 2019-06-12 ENCOUNTER — Other Ambulatory Visit: Payer: Self-pay | Admitting: "Endocrinology

## 2019-06-12 LAB — LIPID PANEL
Cholesterol: 90 (ref 0–200)
HDL: 37 (ref 35–70)
LDL Cholesterol: 33
Triglycerides: 116 (ref 40–160)

## 2019-06-12 LAB — BASIC METABOLIC PANEL
BUN: 20 (ref 4–21)
Creatinine: 1.1 (ref 0.6–1.3)

## 2019-06-13 LAB — HEMOGLOBIN A1C: Hemoglobin A1C: 8.7

## 2019-06-17 ENCOUNTER — Other Ambulatory Visit: Payer: Self-pay

## 2019-06-17 ENCOUNTER — Encounter: Payer: Self-pay | Admitting: "Endocrinology

## 2019-06-17 ENCOUNTER — Ambulatory Visit (INDEPENDENT_AMBULATORY_CARE_PROVIDER_SITE_OTHER): Payer: 59 | Admitting: "Endocrinology

## 2019-06-17 VITALS — BP 137/76 | HR 67 | Ht 70.0 in | Wt 227.8 lb

## 2019-06-17 DIAGNOSIS — E1165 Type 2 diabetes mellitus with hyperglycemia: Secondary | ICD-10-CM

## 2019-06-17 DIAGNOSIS — E782 Mixed hyperlipidemia: Secondary | ICD-10-CM | POA: Diagnosis not present

## 2019-06-17 DIAGNOSIS — I10 Essential (primary) hypertension: Secondary | ICD-10-CM | POA: Diagnosis not present

## 2019-06-17 NOTE — Patient Instructions (Signed)

## 2019-06-17 NOTE — Progress Notes (Signed)
06/17/2019, 1:16 PM       Endocrinology follow-up note    Subjective:    Patient ID: Todd Carey, male    DOB: [birth year].  Todd Carey is being seen in follow-up for management of currently uncontrolled symptomatic type 2 diabetes, hyperlipidemia, hypertension. PMD:  Dairl Ponder, MD.   Past Medical History:  Diagnosis Date  . Arthritis    Right shoulder   . Diabetes mellitus   . Hypercholesteremia    Past Surgical History:  Procedure Laterality Date  . COLONOSCOPY    . COLONOSCOPY N/A 02/08/2016   Procedure: COLONOSCOPY;  Surgeon: Danie Binder, MD;  Location: [facility];  Service: Endoscopy;  Laterality: N/A;  10:00 Am   Social History   Socioeconomic History  . Marital status: Married    Spouse name: Not on file  . Number of children: Not on file  . Years of education: Not on file  . Highest education level: Not on file  Occupational History  . Not on file  Tobacco Use  . Smoking status: Former Smoker    Packs/day: 1.00    Years: 38.00    Pack years: 38.00    Types: Cigarettes    Quit date: 04/18/2013    Years since quitting: 6.1  . Smokeless tobacco: Former Network engineer and Sexual Activity  . Alcohol use: No  . Drug use: No  . Sexual activity: Not on file  Other Topics Concern  . Not on file  Social History Narrative  . Not on file   Social Determinants of Health   Financial Resource Strain:   . Difficulty of Paying Living Expenses:   Food Insecurity:   . Worried About Charity fundraiser in the Last Year:   . Arboriculturist in the Last Year:   Transportation Needs:   . Film/video editor (Medical):   Marland Kitchen Lack of Transportation (Non-Medical):   Physical Activity:   . Days of Exercise per Week:   . Minutes of Exercise per Session:   Stress:   . Feeling of Stress :   Social Connections:   . Frequency of Communication with Friends and Family:   . Frequency of Social Gatherings with Friends and  Family:   . Attends Religious Services:   . Active Member of Clubs or Organizations:   . Attends Archivist Meetings:   Marland Kitchen Marital Status:    Outpatient Encounter Medications as of 06/17/2019  Medication Sig  . aspirin 81 MG tablet Take 81 mg by mouth daily.    Marland Kitchen atorvastatin (LIPITOR) 40 MG tablet Take 40 mg by mouth daily.  . Cholecalciferol (VITAMIN D) 2000 units tablet Take 2,000 Units by mouth daily.  . Continuous Blood Gluc Sensor (FREESTYLE LIBRE 14 DAY SENSOR) MISC INJECT 1 EACH INTO THE SKIN EVERY 14 (FOURTEEN) DAYS. USE AS DIRECTED.  . fish oil-omega-3 fatty acids 1000 MG capsule Take 3 g by mouth daily.    . Insulin Glargine, 2 Unit Dial, (TOUJEO MAX SOLOSTAR) 300 UNIT/ML SOPN Inject 110 Units into the skin at bedtime.  . insulin lispro (HUMALOG KWIKPEN) 100 UNIT/ML KiwkPen Inject 25-31 Units into the skin 3 (three) times daily before meals.  Marland Kitchen losartan (COZAAR) 100 MG tablet Take 100 mg by mouth daily.  . metFORMIN (GLUCOPHAGE) 1000 MG tablet Take 1,000 mg by mouth 2 (two) times daily with a meal.     No facility-administered encounter medications on  file as of 06/17/2019.    ALLERGIES: No Known Allergies  VACCINATION STATUS:  There is no immunization history on file for this patient.  Diabetes He presents for his follow-up diabetic visit. He has type 2 diabetes mellitus. Onset time: He was diagnosed at approximate age of 64 years. His disease course has been improving. There are no hypoglycemic associated symptoms. Pertinent negatives for hypoglycemia include no confusion, headaches, pallor or seizures. Pertinent negatives for diabetes include no blurred vision, no chest pain, no fatigue, no polydipsia, no polyphagia, no polyuria and no weakness. There are no hypoglycemic complications. Symptoms are improving. Risk factors for coronary artery disease include diabetes mellitus, dyslipidemia, family history, male sex, hypertension, obesity, sedentary lifestyle and  tobacco exposure. Current diabetic treatments: He is currently on Levemir 80 units daily, Humalog 15 units 3 times daily, Bydureon 2 mg weekly, metformin 1000 mg twice a day. His weight is fluctuating minimally. He is following a generally unhealthy diet. When asked about meal planning, he reported none. He has not had a previous visit with a dietitian. He rarely participates in exercise. His home blood glucose trend is decreasing steadily. His breakfast blood glucose range is generally 140-180 mg/dl. His lunch blood glucose range is generally 180-200 mg/dl. His dinner blood glucose range is generally 180-200 mg/dl. His bedtime blood glucose range is generally 180-200 mg/dl. His overall blood glucose range is 180-200 mg/dl. (He presents with improving glycemic profile and A1c of 8.7%, increasing from 9.5%.  He did not document or report any hypoglycemia.) An ACE inhibitor/angiotensin II receptor blocker is being taken.  Hyperlipidemia This is a chronic problem. The current episode started more than 1 year ago. The problem is uncontrolled. Exacerbating diseases include diabetes and obesity. Pertinent negatives include no chest pain, myalgias or shortness of breath. Current antihyperlipidemic treatment includes statins. Risk factors for coronary artery disease include dyslipidemia, diabetes mellitus, family history, male sex, obesity, hypertension and a sedentary lifestyle.  Hypertension This is a chronic problem. The current episode started more than 1 year ago. The problem is uncontrolled. Pertinent negatives include no blurred vision, chest pain, headaches, neck pain, palpitations or shortness of breath. Risk factors for coronary artery disease include dyslipidemia, diabetes mellitus, obesity, male gender, smoking/tobacco exposure and sedentary lifestyle. Past treatments include angiotensin blockers.    Review of systems  Constitutional: + Minimally fluctuating body weight,  current  Body mass index is  32.69 kg/m. , no fatigue, no subjective hyperthermia, no subjective hypothermia Eyes: no blurry vision, no xerophthalmia ENT: no sore throat, no nodules palpated in throat, no dysphagia/odynophagia, no hoarseness Cardiovascular: no Chest Pain, no Shortness of Breath, no palpitations, no leg swelling Respiratory: no cough, no shortness of breath Gastrointestinal: no Nausea/Vomiting/Diarhhea Musculoskeletal: no muscle/joint aches Skin: no rashes, no hyperemia Neurological: no tremors, no numbness, no tingling, no dizziness Psychiatric: no depression, no anxiety   Objective:    BP 137/76   Pulse 67   Ht 5\' 10"  (1.778 m)   Wt 227 lb 12.8 oz (103.3 kg)   BMI 32.69 kg/m   Wt Readings from Last 3 Encounters:  06/17/19 227 lb 12.8 oz (103.3 kg)  03/11/19 229 lb 3.2 oz (104 kg)  07/08/18 222 lb (100.7 kg)      Physical Exam- Limited  Constitutional:  Body mass index is 32.69 kg/m. , not in acute distress, normal state of mind Eyes:  EOMI, no exophthalmos Neck: Supple Thyroid: No gross goiter Respiratory: Adequate breathing efforts Musculoskeletal: no gross deformities, strength intact in  all four extremities, no gross restriction of joint movements Skin:  no rashes, no hyperemia Neurological: no tremor with outstretched hands    Recent Results (from the past 2160 hour(s))  Hemoglobin A1c     Status: None   Collection Time: 06/13/19 12:00 AM  Result Value Ref Range   Hemoglobin A1C 8.7    Recent Results (from the past 2160 hour(s))  Basic metabolic panel     Status: None   Collection Time: 06/12/19 12:00 AM  Result Value Ref Range   BUN 20 4 - 21   Creatinine 1.1 0.6 - 1.3  Lipid panel     Status: None   Collection Time: 06/12/19 12:00 AM  Result Value Ref Range   Triglycerides 116 40 - 160   Cholesterol 90 0 - 200   HDL 37 35 - 70   LDL Cholesterol 33   Hemoglobin A1c     Status: None   Collection Time: 06/13/19 12:00 AM  Result Value Ref Range   Hemoglobin A1C  8.7     Assessment & Plan:   1. Uncontrolled type 2 diabetes mellitus with hyperglycemia ([place])  - Todd Carey has currently uncontrolled symptomatic type 2 DM since 64 years of age.  He presents with improving glycemic profile and A1c of 8.7%, increasing from 9.5%.  He did not document or report any hypoglycemia.  He did not bring his CGM device for review today.  - Recent labs reviewed.  -his diabetes is complicated by obesity/sedentary life, and Todd Carey remains at a high risk for more acute and chronic complications which include CAD, CVA, CKD, retinopathy, and neuropathy. These are all discussed in detail with the patient.  - I have counseled him on diet management and weight loss, by adopting a carbohydrate restricted/protein rich diet.  - he  admits there is a room for improvement in his diet and drink choices. -  Suggestion is made for him to avoid simple carbohydrates  from his diet including Cakes, Sweet Desserts / Pastries, Ice Cream, Soda (diet and regular), Sweet Tea, Candies, Chips, Cookies, Sweet Pastries,  Store Bought Juices, Alcohol in Excess of  1-2 drinks a day, Artificial Sweeteners, Coffee Creamer, and "Sugar-free" Products. This will help patient to have stable blood glucose profile and potentially avoid unintended weight gain.  - I encouraged him to switch to  unprocessed or minimally processed complex starch and increased protein intake (animal or plant source), fruits, and vegetables.  - he is advised to stick to a routine mealtimes to eat 3 meals  a day and avoid unnecessary snacks ( to snack only to correct hypoglycemia).   -He follows with a dietitian in [place].  - I have approached him with the following individualized plan to manage diabetes and patient agrees:    -He will continue to require intensive treatment with basal/bolus insulin in order for him to maintain control of diabetes to target. -He is advised to continue Toujeo 110 units  nightly, continue Humalog  Humalog 25- units 3 times daily AC for pre-meal blood glucose  of 90-150mg /dl, plus patient specific correction dose for unexpected hyperglycemia above 150mg /dl, associated with strict monitoring of glucose 4 times a day-before meals and at bedtime. - Patient is warned not to take insulin without proper monitoring per orders. -Adjustment parameters are given for hypo and hyperglycemia in writing. -Patient is encouraged to call clinic for blood glucose levels less than 70 or above 300 mg /dl.  -He is benefiting from  his CGM device, utilizing properly.  He is advised to continue to wear his freestyle libre device at all times.    -He is advised to continue Metformin 1000 mg p.o. twice a day , therapeutically suitable for patient .  -He did not tolerate Bydureon, discontinued.   - Patient specific target  A1c;  LDL, HDL, Triglycerides, and  Waist Circumference were discussed in detail.  2) BP/HTN:  His blood pressure is controlled to target.    He is advised to continue his current blood pressure medications include losartan 100 mg p.o. daily at breakfast.    3) Lipids/HPL:   His recent lipid panel showed controlled LDL at 33.    He is advised to continue atorvastatin 20 mg p.o. nightly.        4)  Weight/Diet: His BMI 0000000 complicating the care of his diabetes.  He is a candidate for modest weight loss.  CDE Consult will be initiated , exercise, and detailed carbohydrates information provided.  5) Chronic Care/Health Maintenance:  -he  is on ACEI/ARB and Statin medications and  is encouraged to continue to follow up with Ophthalmology, Dentist,  Podiatrist at least yearly or according to recommendations, and advised to  stay away from smoking. I have recommended yearly flu vaccine and pneumonia vaccination at least every 5 years; moderate intensity exercise for up to 150 minutes weekly; and  sleep for at least 7 hours a day.  - I advised patient to maintain  close follow up with Dairl Ponder, MD for primary care needs.  - Time spent on this patient care encounter:  35 min, of which > 50% was spent in  counseling and the rest reviewing his blood glucose logs , discussing his hypoglycemia and hyperglycemia episodes, reviewing his current and  previous labs / studies  ( including abstraction from other facilities) and medications  doses and developing a  long term treatment plan and documenting his care.   Please refer to Patient Instructions for Blood Glucose Monitoring and Insulin/Medications Dosing Guide"  in media tab for additional information. Please  also refer to " Patient Self Inventory" in the Media  tab for reviewed elements of pertinent patient history.  Todd Carey participated in the discussions, expressed understanding, and voiced agreement with the above plans.  All questions were answered to his satisfaction. he is encouraged to contact clinic should he have any questions or concerns prior to his return visit.    Follow up plan: - Return in about 4 months (around 10/18/2019) for Bring Meter and Logs- A1c in Office.  Glade Lloyd, MD [facility] [address] [place], [place] [address] Phone: [phone]  Fax: [phone]    06/17/2019, 1:16 PM  This note was partially dictated with voice recognition software. Similar sounding words can be transcribed inadequately or may not  be corrected upon review.

## 2019-07-22 ENCOUNTER — Other Ambulatory Visit: Payer: Self-pay | Admitting: "Endocrinology

## 2019-07-25 ENCOUNTER — Other Ambulatory Visit: Payer: Self-pay | Admitting: "Endocrinology

## 2019-08-20 ENCOUNTER — Other Ambulatory Visit: Payer: Self-pay | Admitting: "Endocrinology

## 2019-09-15 ENCOUNTER — Other Ambulatory Visit: Payer: Self-pay | Admitting: "Endocrinology

## 2019-10-17 LAB — HEMOGLOBIN A1C: Hemoglobin A1C: 9.5

## 2019-10-17 LAB — LIPID PANEL
LDL Cholesterol: 41
Triglycerides: 133 (ref 40–160)

## 2019-10-17 LAB — MICROALBUMIN, URINE: Microalb, Ur: 8.1

## 2019-10-17 LAB — TSH: TSH: 2.36 (ref 0.41–5.90)

## 2019-10-17 LAB — HEPATIC FUNCTION PANEL
ALT: 48 — AB (ref 10–40)
AST: 41 — AB (ref 14–40)

## 2019-10-17 LAB — BASIC METABOLIC PANEL
BUN: 16 (ref 4–21)
Creatinine: 1.1 (ref 0.6–1.3)

## 2019-10-17 LAB — COMPREHENSIVE METABOLIC PANEL: GFR calc non Af Amer: 75

## 2019-10-21 ENCOUNTER — Ambulatory Visit: Payer: 59 | Admitting: "Endocrinology

## 2019-10-22 ENCOUNTER — Encounter: Payer: Self-pay | Admitting: Nurse Practitioner

## 2019-10-22 ENCOUNTER — Other Ambulatory Visit: Payer: Self-pay

## 2019-10-22 ENCOUNTER — Ambulatory Visit: Payer: 59 | Admitting: Nurse Practitioner

## 2019-10-22 VITALS — BP 138/74 | HR 73 | Ht 70.0 in | Wt 227.4 lb

## 2019-10-22 DIAGNOSIS — E1165 Type 2 diabetes mellitus with hyperglycemia: Secondary | ICD-10-CM

## 2019-10-22 DIAGNOSIS — I10 Essential (primary) hypertension: Secondary | ICD-10-CM

## 2019-10-22 DIAGNOSIS — E559 Vitamin D deficiency, unspecified: Secondary | ICD-10-CM

## 2019-10-22 DIAGNOSIS — E782 Mixed hyperlipidemia: Secondary | ICD-10-CM

## 2019-10-22 MED ORDER — GLIPIZIDE ER 5 MG PO TB24
5.0000 mg | ORAL_TABLET | Freq: Every day | ORAL | 3 refills | Status: DC
Start: 2019-10-22 — End: 2020-10-07

## 2019-10-22 NOTE — Patient Instructions (Signed)
Advice for Weight Management  -For most of us the best way to lose weight is by diet management. Generally speaking, diet management means consuming less calories intentionally which over time brings about progressive weight loss.  This can be achieved more effectively by restricting carbohydrate consumption to the minimum possible.  So, it is critically important to know your numbers: how much calorie you are consuming and how much calorie you need. More importantly, our carbohydrates sources should be unprocessed or minimally processed complex starch food items.   Sometimes, it is important to balance nutrition by increasing protein intake (animal or plant source), fruits, and vegetables.  -Sticking to a routine mealtime to eat 3 meals a day and avoiding unnecessary snacks is shown to have a big role in weight control. Under normal circumstances, the only time we lose real weight is when we are hungry, so allow hunger to take place- hunger means no food between meal times, only water.  It is not advisable to starve.   -It is better to avoid simple carbohydrates including: Cakes, Sweet Desserts, Ice Cream, Soda (diet and regular), Sweet Tea, Candies, Chips, Cookies, Store Bought Juices, Alcohol in Excess of  1-2 drinks a day, Artificial Sweeteners, Doughnuts, Coffee Creamers, "Sugar-free" Products, etc, etc.  This is not a complete list.....    -Consulting with certified diabetes educators is proven to provide you with the most accurate and current information on diet.  Also, you may be  interested in discussing diet options/exchanges , we can schedule a visit with Todd Carey, RDN, CDE for individualized nutrition education.  -Exercise: If you are able: 30 -60 minutes a day ,4 days a week, or 150 minutes a week.  The longer the better.  Combine stretch, strength, and aerobic activities.  If you were told in the past that you have high risk for cardiovascular diseases, you may seek evaluation by  your heart doctor prior to initiating moderate to intense exercise programs.    

## 2019-10-22 NOTE — Progress Notes (Signed)
10/22/2019, 1:55 PM       Endocrinology follow-up note    Subjective:    Patient ID: Todd Carey, male    DOB: 03-23-1955.  Todd Carey is being seen in follow-up for management of currently uncontrolled symptomatic type 2 diabetes, hyperlipidemia, hypertension. PMD:  Dairl Ponder, MD.   Past Medical History:  Diagnosis Date   Arthritis    Right shoulder    Diabetes mellitus    Hypercholesteremia    Past Surgical History:  Procedure Laterality Date   COLONOSCOPY     COLONOSCOPY N/A 02/08/2016   Procedure: COLONOSCOPY;  Surgeon: Danie Binder, MD;  Location: [facility];  Service: Endoscopy;  Laterality: N/A;  10:00 Am   Social History   Socioeconomic History   Marital status: Married    Spouse name: Not on file   Number of children: Not on file   Years of education: Not on file   Highest education level: Not on file  Occupational History   Not on file  Tobacco Use   Smoking status: Former Smoker    Packs/day: 1.00    Years: 38.00    Pack years: 38.00    Types: Cigarettes    Quit date: 04/18/2013    Years since quitting: 6.5   Smokeless tobacco: Former Counsellor Use: Never used  Substance and Sexual Activity   Alcohol use: No   Drug use: No   Sexual activity: Not on file  Other Topics Concern   Not on file  Social History Narrative   Not on file   Social Determinants of Health   Financial Resource Strain:    Difficulty of Paying Living Expenses: Not on file  Food Insecurity:    Worried About Charity fundraiser in the Last Year: Not on file   [employer] of Food in the Last Year: Not on file  Transportation Needs:    Lack of Transportation (Medical): Not on file   Lack of Transportation (Non-Medical): Not on file  Physical Activity:    Days of Exercise per Week: Not on file   Minutes of Exercise per Session: Not on file  Stress:    Feeling of Stress : Not on file   Social Connections:    Frequency of Communication with Friends and Family: Not on file   Frequency of Social Gatherings with Friends and Family: Not on file   Attends Religious Services: Not on file   Active Member of Clubs or Organizations: Not on file   Attends Club or Organization Meetings: Not on file   Marital Status: Not on file   Outpatient Encounter Medications as of 10/22/2019  Medication Sig   amLODipine (NORVASC) 5 MG tablet Take 5 mg by mouth daily.   aspirin 81 MG tablet Take 81 mg by mouth daily.     atorvastatin (LIPITOR) 40 MG tablet Take 40 mg by mouth daily.   Cholecalciferol (VITAMIN D) 2000 units tablet Take 2,000 Units by mouth daily.   Continuous Blood Gluc Sensor (FREESTYLE LIBRE 14 DAY SENSOR) MISC INJECT 1 EACH INTO THE SKIN EVERY 14 (FOURTEEN) DAYS. USE AS DIRECTED.   fish oil-omega-3 fatty acids 1000 MG capsule Take 3 g by mouth daily.     insulin glargine, 2 Unit Dial, (TOUJEO MAX SOLOSTAR) 300 UNIT/ML Solostar Pen Inject 110 Units into the skin at bedtime.   insulin lispro (HUMALOG KWIKPEN) 100 UNIT/ML KiwkPen Inject 25-31  Units into the skin 3 (three) times daily before meals.   Insulin Pen Needle 32G X 6 MM MISC SMARTSIG:1 Needle SUB-Q Twice Daily   losartan (COZAAR) 100 MG tablet Take 100 mg by mouth daily.   metFORMIN (GLUCOPHAGE) 1000 MG tablet Take 1,000 mg by mouth 2 (two) times daily with a meal.     glipiZIDE (GLUCOTROL XL) 5 MG 24 hr tablet Take 1 tablet (5 mg total) by mouth daily with breakfast.   No facility-administered encounter medications on file as of 10/22/2019.    ALLERGIES: No Known Allergies  VACCINATION STATUS:  There is no immunization history on file for this patient.  Diabetes He presents for his follow-up diabetic visit. He has type 2 diabetes mellitus. Onset time: He was diagnosed at approximate age of 64 years. His disease course has been improving. There are no hypoglycemic associated symptoms. Pertinent  negatives for hypoglycemia include no confusion, headaches, pallor or seizures. Pertinent negatives for diabetes include no blurred vision, no chest pain, no fatigue, no polydipsia, no polyphagia, no polyuria and no weakness. There are no hypoglycemic complications. Symptoms are improving. Risk factors for coronary artery disease include diabetes mellitus, dyslipidemia, family history, male sex, hypertension, obesity, sedentary lifestyle and tobacco exposure. Current diabetic treatment includes intensive insulin program and oral agent (monotherapy). He is compliant with treatment most of the time. His weight is fluctuating minimally. He is following a generally unhealthy diet. When asked about meal planning, he reported none. He has not had a previous visit with a dietitian. He participates in exercise daily (walks at the mall daily). His home blood glucose trend is decreasing steadily. His breakfast blood glucose range is generally >200 mg/dl. His lunch blood glucose range is generally 140-180 mg/dl. His dinner blood glucose range is generally 110-130 mg/dl. His bedtime blood glucose range is generally 140-180 mg/dl. (He presents today with his CGM and logs showing significantly above target fasting and postprandial glycemic profile.  His A1C was recently checked at his PCP and was 9.5%, worsening from last visit of 8.7%.  He admits to not eating like he should and eating more snacks.  His CGM shows TIR 47%, TAR 51%, TBR 2%.  There are rare episodes of hypoglycemia associated with inadequate meal intake.) An ACE inhibitor/angiotensin II receptor blocker is being taken. He does not see a podiatrist.Eye exam is current.  Hyperlipidemia This is a chronic problem. The current episode started more than 1 year ago. The problem is controlled. Recent lipid tests were reviewed and are normal. Exacerbating diseases include diabetes and obesity. Factors aggravating his hyperlipidemia include fatty foods. Pertinent negatives  include no chest pain, myalgias or shortness of breath. Current antihyperlipidemic treatment includes statins. The current treatment provides moderate improvement of lipids. There are no compliance problems.  Risk factors for coronary artery disease include dyslipidemia, diabetes mellitus, family history, male sex, obesity, hypertension and a sedentary lifestyle.  Hypertension This is a chronic problem. The current episode started more than 1 year ago. The problem has been gradually improving since onset. The problem is controlled. Pertinent negatives include no blurred vision, chest pain, headaches, neck pain, palpitations or shortness of breath. Risk factors for coronary artery disease include dyslipidemia, diabetes mellitus, obesity, male gender, smoking/tobacco exposure and sedentary lifestyle. Past treatments include angiotensin blockers and calcium channel blockers. The current treatment provides moderate improvement. There are no compliance problems.     Review of systems  Constitutional: + Minimally fluctuating body weight,  current Body mass index is 32.63  kg/m. , no fatigue, no subjective hyperthermia, no subjective hypothermia Eyes: no blurry vision, no xerophthalmia ENT: no sore throat, no nodules palpated in throat, no dysphagia/odynophagia, no hoarseness Cardiovascular: no chest pain, no shortness of breath, no palpitations, no leg swelling Respiratory: no cough, no shortness of breath Gastrointestinal: no nausea/vomiting/diarrhea Musculoskeletal: no muscle/joint aches Skin: no rashes, no hyperemia Neurological: no tremors, no numbness, no tingling, no dizziness Psychiatric: no depression, no anxiety   Objective:    BP 138/74 (BP Location: Right Arm)    Pulse 73    Ht 5\' 10"  (1.778 m)    Wt 227 lb 6.4 oz (103.1 kg)    BMI 32.63 kg/m   Wt Readings from Last 3 Encounters:  10/22/19 227 lb 6.4 oz (103.1 kg)  06/17/19 227 lb 12.8 oz (103.3 kg)  03/11/19 229 lb 3.2 oz (104 kg)     BP Readings from Last 3 Encounters:  10/22/19 138/74  06/17/19 137/76  03/11/19 (!) 153/81     Physical Exam- Limited  Constitutional:  Body mass index is 32.63 kg/m. , not in acute distress, normal state of mind Eyes:  EOMI, no exophthalmos Neck: Supple Thyroid: No gross goiter Cardiovascular: RRR, no murmers, rubs, or gallops, no edema Respiratory: Adequate breathing efforts, no crackles, rales, rhonchi, or wheezing Musculoskeletal: no gross deformities, strength intact in all four extremities, no gross restriction of joint movements Skin:  no rashes, no hyperemia Neurological: no tremor with outstretched hands   Recent Results (from the past 2160 hour(s))  Microalbumin, urine     Status: None   Collection Time: 10/17/19 12:00 AM  Result Value Ref Range   Microalb, Ur 8.1   Basic metabolic panel     Status: None   Collection Time: 10/17/19 12:00 AM  Result Value Ref Range   BUN 16 4 - 21   Creatinine 1.1 0.6 - 1.3  Comprehensive metabolic panel     Status: None   Collection Time: 10/17/19 12:00 AM  Result Value Ref Range   GFR calc non Af Amer 75   Lipid panel     Status: None   Collection Time: 10/17/19 12:00 AM  Result Value Ref Range   Triglycerides 133 40 - 160   LDL Cholesterol 41   Hepatic function panel     Status: Abnormal   Collection Time: 10/17/19 12:00 AM  Result Value Ref Range   ALT 48 (A) 10 - 40   AST 41 (A) 14 - 40  Hemoglobin A1c     Status: None   Collection Time: 10/17/19 12:00 AM  Result Value Ref Range   Hemoglobin A1C 9.5   TSH     Status: None   Collection Time: 10/17/19 12:00 AM  Result Value Ref Range   TSH 2.36 0.41 - 5.90   Recent Results (from the past 2160 hour(s))  Microalbumin, urine     Status: None   Collection Time: 10/17/19 12:00 AM  Result Value Ref Range   Microalb, Ur 8.1   Basic metabolic panel     Status: None   Collection Time: 10/17/19 12:00 AM  Result Value Ref Range   BUN 16 4 - 21   Creatinine 1.1 0.6 -  1.3  Comprehensive metabolic panel     Status: None   Collection Time: 10/17/19 12:00 AM  Result Value Ref Range   GFR calc non Af Amer 75   Lipid panel     Status: None   Collection Time: 10/17/19 12:00 AM  Result Value Ref Range   Triglycerides 133 40 - 160   LDL Cholesterol 41   Hepatic function panel     Status: Abnormal   Collection Time: 10/17/19 12:00 AM  Result Value Ref Range   ALT 48 (A) 10 - 40   AST 41 (A) 14 - 40  Hemoglobin A1c     Status: None   Collection Time: 10/17/19 12:00 AM  Result Value Ref Range   Hemoglobin A1C 9.5   TSH     Status: None   Collection Time: 10/17/19 12:00 AM  Result Value Ref Range   TSH 2.36 0.41 - 5.90    Assessment & Plan:   1. Uncontrolled type 2 diabetes mellitus with hyperglycemia ([place])  - Todd Carey has currently uncontrolled symptomatic type 2 DM since 64 years of age.  He presents today with his CGM and logs showing significantly above target fasting and postprandial glycemic profile.  His A1C was recently checked at his PCP and was 9.5%, worsening from last visit of 8.7%.  He admits to not eating like he should and eating more snacks.  His CGM shows TIR 47%, TAR 51%, TBR 2%.  There are rare episodes of hypoglycemia associated with inadequate meal intake.  - Recent labs reviewed.  -his diabetes is complicated by obesity/sedentary life, and Todd Carey remains at a high risk for more acute and chronic complications which include CAD, CVA, CKD, retinopathy, and neuropathy. These are all discussed in detail with the patient.  - The patient admits there is a room for improvement in their diet and drink choices. -  Suggestion is made for the patient to avoid simple carbohydrates from their diet including Cakes, Sweet Desserts / Pastries, Ice Cream, Soda (diet and regular), Sweet Tea, Candies, Chips, Cookies, Sweet Pastries,  Store Bought Juices, Alcohol in Excess of  1-2 drinks a day, Artificial Sweeteners, Coffee Creamer, and  "Sugar-free" Products. This will help patient to have stable blood glucose profile and potentially avoid unintended weight gain.   - I encouraged the patient to switch to  unprocessed or minimally processed complex starch and increased protein intake (animal or plant source), fruits, and vegetables.   - Patient is advised to stick to a routine mealtimes to eat 3 meals  a day and avoid unnecessary snacks ( to snack only to correct hypoglycemia).  -He follows with a dietitian in [place].  - I have approached him with the following individualized plan to manage diabetes and patient agrees:   -He will continue to require intensive treatment with basal/bolus insulin in order for him to maintain control of diabetes to target. -He has been doing better within the last week or two with his diet and exercise.  He is advised to continue current dose of Toujeo 110 units SQ daily at bedtime, continue Humalog 25-31 units SQ TID with meals if glucose above 90 and he is eating.  Specific instructions on how to titrate insulin dose based on glucose readings given to patient in writing.  He is also advised to continue Metformin 1000 mg po twice daily with meals. -I discussed and initiated low-dose glipizide 5 mg XL daily with breakfast.  -He is encouraged to continue using CGM to monitor blood glucose at least 4 times per day, before meals and at bedtime, and call the clinic for readings less than 70 or greater than 200 for 3 tests in a row.  -He will be considered for incretin therapy as appropriate at next  visit.  -If he continues to need high doses of insulin (200+ units per day), will need to consider switching to U-500 insulin for better absorption.  - Patient specific target  A1c;  LDL, HDL, Triglycerides, and  Waist Circumference were discussed in detail.  2) BP/HTN:  His blood pressure is controlled to target.  He is advised to continue Losartan 100 mg po daily and Amlodipine 5 mg po daily (recently  started by PCP).  3) Lipids/HPL:  His most recent lipid panel from 10/16/19 shows controlled LDL at 41 and Triglycerides of 133.  He is advised to continue Atorvastatin 40 mg po daily at bedtime, and fish oil 3g PO daily.    4)  Weight/Diet:  His Body mass index is 32.63 kg/m.-clearly complicating the care of his diabetes.  He is a candidate for modest weight loss.  CDE Consult will be initiated , exercise, and detailed carbohydrates information provided.  5) Chronic Care/Health Maintenance: -he is on ACEI/ARB and Statin medications and is encouraged to continue to follow up with Ophthalmology, Dentist,  Podiatrist at least yearly or according to recommendations, and advised to  stay away from smoking. I have recommended yearly flu vaccine and pneumonia vaccination at least every 5 years; moderate intensity exercise for up to 150 minutes weekly; and  sleep for at least 7 hours a day.  - I advised patient to maintain close follow up with Dairl Ponder, MD for primary care needs.  - Time spent on this patient care encounter:  35 min, of which > 50% was spent in  counseling and the rest reviewing his blood glucose logs , discussing his hypoglycemia and hyperglycemia episodes, reviewing his current and  previous labs / studies  ( including abstraction from other facilities) and medications  doses and developing a  long term treatment plan and documenting his care.   Please refer to Patient Instructions for Blood Glucose Monitoring and Insulin/Medications Dosing Guide"  in media tab for additional information. Please  also refer to " Patient Self Inventory" in the Media  tab for reviewed elements of pertinent patient history.  Todd Carey participated in the discussions, expressed understanding, and voiced agreement with the above plans.  All questions were answered to his satisfaction. he is encouraged to contact clinic should he have any questions or concerns prior to his return  visit.    Follow up plan: - Return in about 4 months (around 02/21/2020) for Diabetes follow up, Previsit labs, Virtual visit ok.  Todd Carey, [facility] [address] [place], [place] [address] Phone: [phone] Fax: [phone]    10/22/2019, 1:55 PM  This note was partially dictated with voice recognition software. Similar sounding words can be transcribed inadequately or may not  be corrected upon review.

## 2019-11-06 ENCOUNTER — Other Ambulatory Visit: Payer: Self-pay | Admitting: "Endocrinology

## 2019-11-26 ENCOUNTER — Other Ambulatory Visit: Payer: Self-pay | Admitting: "Endocrinology

## 2020-02-19 LAB — COMPREHENSIVE METABOLIC PANEL
Albumin: 4.4 (ref 3.5–5.0)
Calcium: 9.7 (ref 8.7–10.7)
GFR calc non Af Amer: 70

## 2020-02-19 LAB — BASIC METABOLIC PANEL
BUN: 14 (ref 4–21)
Chloride: 102 (ref 99–108)
Creatinine: 1.1 (ref 0.6–1.3)
Glucose: 237
Potassium: 4.8 (ref 3.4–5.3)
Sodium: 140 (ref 137–147)

## 2020-02-19 LAB — VITAMIN D 25 HYDROXY (VIT D DEFICIENCY, FRACTURES): Vit D, 25-Hydroxy: 31

## 2020-02-19 LAB — HEPATIC FUNCTION PANEL
ALT: 33 (ref 10–40)
AST: 26 (ref 14–40)
Alkaline Phosphatase: 57 (ref 25–125)

## 2020-02-19 LAB — HEMOGLOBIN A1C: Hemoglobin A1C: 9.8

## 2020-02-24 ENCOUNTER — Ambulatory Visit: Payer: 59 | Admitting: Nurse Practitioner

## 2020-02-24 ENCOUNTER — Other Ambulatory Visit: Payer: Self-pay

## 2020-02-24 ENCOUNTER — Encounter: Payer: Self-pay | Admitting: Nurse Practitioner

## 2020-02-24 VITALS — BP 138/75 | HR 75 | Ht 70.0 in | Wt 227.8 lb

## 2020-02-24 DIAGNOSIS — E1165 Type 2 diabetes mellitus with hyperglycemia: Secondary | ICD-10-CM | POA: Diagnosis not present

## 2020-02-24 DIAGNOSIS — I10 Essential (primary) hypertension: Secondary | ICD-10-CM

## 2020-02-24 DIAGNOSIS — E782 Mixed hyperlipidemia: Secondary | ICD-10-CM

## 2020-02-24 DIAGNOSIS — E559 Vitamin D deficiency, unspecified: Secondary | ICD-10-CM | POA: Diagnosis not present

## 2020-02-24 MED ORDER — TOUJEO MAX SOLOSTAR 300 UNIT/ML ~~LOC~~ SOPN: 100.0000 [IU] | PEN_INJECTOR | Freq: Every day | SUBCUTANEOUS | 2 refills | Status: DC

## 2020-02-24 NOTE — Patient Instructions (Signed)

## 2020-02-24 NOTE — Progress Notes (Signed)
02/24/2020, 1:43 PM       Endocrinology follow-up note    Subjective:    Patient ID: Todd Carey, male    DOB: 09-Feb-1955.   Todd Carey is being seen in follow-up for management of currently uncontrolled symptomatic type 2 diabetes, hyperlipidemia, hypertension. PMD:  Dairl Ponder, MD.   Past Medical History:  Diagnosis Date  . Arthritis    Right shoulder   . Diabetes mellitus   . Hypercholesteremia    Past Surgical History:  Procedure Laterality Date  . COLONOSCOPY    . COLONOSCOPY N/A 02/08/2016   Procedure: COLONOSCOPY;  Surgeon: Danie Binder, MD;  Location: [facility];  Service: Endoscopy;  Laterality: N/A;  10:00 Am   Social History   Socioeconomic History  . Marital status: Married    Spouse name: Not on file  . Number of children: Not on file  . Years of education: Not on file  . Highest education level: Not on file  Occupational History  . Not on file  Tobacco Use  . Smoking status: Former Smoker    Packs/day: 1.00    Years: 38.00    Pack years: 38.00    Types: Cigarettes    Quit date: 04/18/2013    Years since quitting: 6.8  . Smokeless tobacco: Former Network engineer  . Vaping Use: Never used  Substance and Sexual Activity  . Alcohol use: No  . Drug use: No  . Sexual activity: Not on file  Other Topics Concern  . Not on file  Social History Narrative  . Not on file   Social Determinants of Health   Financial Resource Strain: Not on file  Food Insecurity: Not on file  Transportation Needs: Not on file  Physical Activity: Not on file  Stress: Not on file  Social Connections: Not on file   Outpatient Encounter Medications as of 02/24/2020  Medication Sig  . amLODipine (NORVASC) 5 MG tablet Take 5 mg by mouth daily.  Marland Kitchen aspirin 81 MG tablet Take 81 mg by mouth daily.  Marland Kitchen atorvastatin (LIPITOR) 40 MG tablet Take 40 mg by mouth daily.  . Cholecalciferol (VITAMIN D) 2000 units tablet Take 2,000 Units  by mouth daily.  . Continuous Blood Gluc Sensor (FREESTYLE LIBRE 14 DAY SENSOR) MISC INJECT 1 EACH INTO THE SKIN EVERY 14 (FOURTEEN) DAYS. USE AS DIRECTED.  . fish oil-omega-3 fatty acids 1000 MG capsule Take 3 g by mouth daily.  Marland Kitchen glipiZIDE (GLUCOTROL XL) 5 MG 24 hr tablet Take 1 tablet (5 mg total) by mouth daily with breakfast.  . insulin lispro (HUMALOG) 100 UNIT/ML KiwkPen Inject 25-31 Units into the skin 3 (three) times daily before meals.  . Insulin Pen Needle 32G X 6 MM MISC SMARTSIG:1 Needle SUB-Q Twice Daily  . losartan (COZAAR) 100 MG tablet Take 100 mg by mouth daily.  . metFORMIN (GLUCOPHAGE) 1000 MG tablet Take 1,000 mg by mouth 2 (two) times daily with a meal.  . [DISCONTINUED] TOUJEO MAX SOLOSTAR 300 UNIT/ML Solostar Pen INJECT 110 UNITS INTO THE SKIN AT BEDTIME.  . insulin glargine, 2 Unit Dial, (TOUJEO MAX SOLOSTAR) 300 UNIT/ML Solostar Pen Inject 100 Units into the skin at bedtime.   No facility-administered encounter medications on file as of 02/24/2020.    ALLERGIES: No Known Allergies  VACCINATION STATUS:  There is no immunization history on file for this patient.  Diabetes He presents for his follow-up diabetic visit. He  has type 2 diabetes mellitus. Onset time: He was diagnosed at approximate age of 64 years. His disease course has been worsening. There are no hypoglycemic associated symptoms. Pertinent negatives for hypoglycemia include no confusion, headaches, pallor or seizures. Associated symptoms include fatigue, polydipsia and polyuria. Pertinent negatives for diabetes include no blurred vision, no chest pain, no polyphagia and no weakness. There are no hypoglycemic complications. Symptoms are worsening. There are no diabetic complications. Risk factors for coronary artery disease include diabetes mellitus, dyslipidemia, family history, male sex, hypertension, obesity, sedentary lifestyle and tobacco exposure. Current diabetic treatment includes intensive insulin  program and oral agent (dual therapy). He is compliant with treatment most of the time. His weight is fluctuating minimally. He is following a generally unhealthy diet. When asked about meal planning, he reported none. He has not had a previous visit with a dietitian. He participates in exercise daily (walks at the mall daily). His home blood glucose trend is fluctuating minimally. His breakfast blood glucose range is generally 180-200 mg/dl. His lunch blood glucose range is generally 140-180 mg/dl. His dinner blood glucose range is generally 140-180 mg/dl. His bedtime blood glucose range is generally 130-140 mg/dl. His overall blood glucose range is 140-180 mg/dl. (He presents today with his CGM and logs showing above target fasting and postprandial glycemic profile.  His previsit A1c done at his PCP office was 9.8%, worsening from previous visit of 9.5%.  Analysis of his CGM shows TIR 58%, TAR 41%, TBR 1%.  He denies any major hypoglycemia.  He had only been injecting 90 units of his Toujeo instead of the 110 recommended at last visit.) An ACE inhibitor/angiotensin II receptor blocker is being taken. He does not see a podiatrist.Eye exam is current.  Hyperlipidemia This is a chronic problem. The current episode started more than 1 year ago. The problem is controlled. Recent lipid tests were reviewed and are normal. Exacerbating diseases include chronic renal disease, diabetes and obesity. Factors aggravating his hyperlipidemia include fatty foods. Pertinent negatives include no chest pain, myalgias or shortness of breath. Current antihyperlipidemic treatment includes statins. The current treatment provides moderate improvement of lipids. There are no compliance problems.  Risk factors for coronary artery disease include dyslipidemia, diabetes mellitus, family history, male sex, obesity, hypertension and a sedentary lifestyle.  Hypertension This is a chronic problem. The current episode started more than 1 year  ago. The problem has been gradually improving since onset. The problem is controlled. Pertinent negatives include no blurred vision, chest pain, headaches, neck pain, palpitations or shortness of breath. There are no associated agents to hypertension. Risk factors for coronary artery disease include dyslipidemia, diabetes mellitus, obesity, male gender, smoking/tobacco exposure and sedentary lifestyle. Past treatments include angiotensin blockers and calcium channel blockers. The current treatment provides moderate improvement. There are no compliance problems.  Hypertensive end-organ damage includes kidney disease. Identifiable causes of hypertension include chronic renal disease.    Review of systems  Constitutional: + Minimally fluctuating body weight,  current Body mass index is 32.69 kg/m. , + fatigue, no subjective hyperthermia, no subjective hypothermia Eyes: no blurry vision, no xerophthalmia ENT: no sore throat, no nodules palpated in throat, no dysphagia/odynophagia, no hoarseness Cardiovascular: no chest pain, no shortness of breath, no palpitations, no leg swelling Respiratory: no cough, no shortness of breath Gastrointestinal: no nausea/vomiting/diarrhea Musculoskeletal: no muscle/joint aches Skin: no rashes, no hyperemia Neurological: no tremors, no numbness, no tingling, no dizziness Psychiatric: no depression, no anxiety   Objective:    BP 138/75 (  BP Location: Left Arm)   Pulse 75   Ht 5\' 10"  (1.778 m)   Wt 227 lb 12.8 oz (103.3 kg)   BMI 32.69 kg/m   Wt Readings from Last 3 Encounters:  02/24/20 227 lb 12.8 oz (103.3 kg)  10/22/19 227 lb 6.4 oz (103.1 kg)  06/17/19 227 lb 12.8 oz (103.3 kg)    BP Readings from Last 3 Encounters:  02/24/20 138/75  10/22/19 138/74  06/17/19 137/76    Physical Exam- Limited  Constitutional:  Body mass index is 32.69 kg/m. , not in acute distress, normal state of mind Eyes:  EOMI, no exophthalmos Neck: Supple Cardiovascular:  RRR, no murmers, rubs, or gallops, no edema Respiratory: Adequate breathing efforts, no crackles, rales, rhonchi, or wheezing Musculoskeletal: no gross deformities, strength intact in all four extremities, no gross restriction of joint movements Skin:  no rashes, no hyperemia Neurological: no tremor with outstretched hands   Foot exam:   No rashes, ulcers, cuts, calluses, onychodystrophy.   Good pulses bilat.  Slightly decreased sensation to 10 g monofilament bilat.  + fungal infection between digits bilaterally    POCT ABI Results 02/24/20   Right ABI:  1.17      Left ABI:  1.20  Right leg systolic / diastolic: 725/36 mmHg Left leg systolic / diastolic: 644/03 mmHg  Arm systolic / diastolic: 474/25 mmHG  Detailed report will be scanned into patient chart.    Recent Results (from the past 2160 hour(s))  Basic metabolic panel     Status: None   Collection Time: 02/19/20 12:00 AM  Result Value Ref Range   Glucose 237    BUN 14 4 - 21   Creatinine 1.1 0.6 - 1.3   Potassium 4.8 3.4 - 5.3   Sodium 140 137 - 147   Chloride 102 99 - 108  Comprehensive metabolic panel     Status: None   Collection Time: 02/19/20 12:00 AM  Result Value Ref Range   GFR calc non Af Amer 70    Calcium 9.7 8.7 - 10.7   Albumin 4.4 3.5 - 5.0  Hepatic function panel     Status: None   Collection Time: 02/19/20 12:00 AM  Result Value Ref Range   Alkaline Phosphatase 57 25 - 125   ALT 33 10 - 40   AST 26 14 - 40  VITAMIN D 25 Hydroxy (Vit-D Deficiency, Fractures)     Status: None   Collection Time: 02/19/20 12:00 AM  Result Value Ref Range   Vit D, 25-Hydroxy 31   Hemoglobin A1c     Status: None   Collection Time: 02/19/20 12:00 AM  Result Value Ref Range   Hemoglobin A1C 9.8    Recent Results (from the past 2160 hour(s))  Basic metabolic panel     Status: None   Collection Time: 02/19/20 12:00 AM  Result Value Ref Range   Glucose 237    BUN 14 4 - 21   Creatinine 1.1 0.6 - 1.3    Potassium 4.8 3.4 - 5.3   Sodium 140 137 - 147   Chloride 102 99 - 108  Comprehensive metabolic panel     Status: None   Collection Time: 02/19/20 12:00 AM  Result Value Ref Range   GFR calc non Af Amer 70    Calcium 9.7 8.7 - 10.7   Albumin 4.4 3.5 - 5.0  Hepatic function panel     Status: None   Collection Time: 02/19/20 12:00 AM  Result Value Ref Range   Alkaline Phosphatase 57 25 - 125   ALT 33 10 - 40   AST 26 14 - 40  VITAMIN D 25 Hydroxy (Vit-D Deficiency, Fractures)     Status: None   Collection Time: 02/19/20 12:00 AM  Result Value Ref Range   Vit D, 25-Hydroxy 31   Hemoglobin A1c     Status: None   Collection Time: 02/19/20 12:00 AM  Result Value Ref Range   Hemoglobin A1C 9.8     Assessment & Plan:   1) Uncontrolled type 2 diabetes mellitus with hyperglycemia ([place])  - Todd Carey has currently uncontrolled symptomatic type 2 DM since 65 years of age.  He presents today with his CGM and logs showing above target fasting and postprandial glycemic profile.  His previsit A1c done at his PCP office was 9.8%, worsening from previous visit of 9.5%.  Analysis of his CGM shows TIR 58%, TAR 41%, TBR 1%.  He denies any major hypoglycemia.  He had only been injecting 90 units of his Toujeo instead of the 110 recommended at last visit.  - Recent labs reviewed.  -his diabetes is complicated by obesity/sedentary life, and Todd Carey remains at a high risk for more acute and chronic complications which include CAD, CVA, CKD, retinopathy, and neuropathy. These are all discussed in detail with the patient.  - Nutritional counseling repeated at each appointment due to patients tendency to fall back in to old habits.  - The patient admits there is a room for improvement in their diet and drink choices. -  Suggestion is made for the patient to avoid simple carbohydrates from their diet including Cakes, Sweet Desserts / Pastries, Ice Cream, Soda (diet and regular), Sweet Tea,  Candies, Chips, Cookies, Sweet Pastries,  Store Bought Juices, Alcohol in Excess of  1-2 drinks a day, Artificial Sweeteners, Coffee Creamer, and "Sugar-free" Products. This will help patient to have stable blood glucose profile and potentially avoid unintended weight gain.   - I encouraged the patient to switch to  unprocessed or minimally processed complex starch and increased protein intake (animal or plant source), fruits, and vegetables.   - Patient is advised to stick to a routine mealtimes to eat 3 meals  a day and avoid unnecessary snacks ( to snack only to correct hypoglycemia).  -He follows with a dietitian in [place].  - I have approached him with the following individualized plan to manage diabetes and patient agrees:   -He will continue to require intensive treatment with basal/bolus insulin in order for him to maintain control of diabetes to target.  -He will tolerate slight increase with his Toujeo to 100 units SQ daily at bedtime.  He is to continue current dose of Humalog 25-31 units TID with meals if glucose is above 90 and he is eating.  Specific instructions on how to titrate insulin dose based on glucose readings given to patient in writing. -He is to continue Metformin 1000 mg po twice daily with meals and Glipizide 5 mg XL daily with breakfast.  -I discussed and initiated Trulicity [id number] mg SQ weekly x 2 weeks then may advance to 1.5 mg SQ weekly thereafter if tolerated.  Samples provided from office today.  -He is encouraged to continue using CGM to monitor blood glucose at least 4 times per day, before meals and at bedtime, and call the clinic for readings less than 70 or greater than 200 for 3 tests in a row.  -If  he continues to need high doses of insulin (200+ units per day), will need to consider switching to U-500 insulin for better absorption.  - Patient specific target  A1c;  LDL, HDL, Triglycerides, and  Waist Circumference were discussed in detail.  2) BP/HTN:   His blood pressure is controlled to target.  He is advised to continue Losartan 100 mg po daily and Amlodipine 5 mg po daily.  3) Lipids/HPL:  His most recent lipid panel from 10/16/19 shows controlled LDL at 41 and Triglycerides of 133.  He is advised to continue Atorvastatin 40 mg po daily at bedtime, and fish oil 3g PO daily.    4)  Weight/Diet:  His Body mass index is 32.69 kg/m.-clearly complicating the care of his diabetes.  He is a candidate for modest weight loss.  CDE Consult will be initiated , exercise, and detailed carbohydrates information provided.  5) Chronic Care/Health Maintenance: -he is on ACEI/ARB and Statin medications and is encouraged to continue to follow up with Ophthalmology, Dentist,  Podiatrist at least yearly or according to recommendations, and advised to  stay away from smoking. I have recommended yearly flu vaccine and pneumonia vaccination at least every 5 years; moderate intensity exercise for up to 150 minutes weekly; and  sleep for at least 7 hours a day.  - I advised patient to maintain close follow up with Dairl Ponder, MD for primary care needs.  - Time spent on this patient care encounter:  35 min, of which > 50% was spent in  counseling and the rest reviewing his blood glucose logs , discussing his hypoglycemia and hyperglycemia episodes, reviewing his current and  previous labs / studies  ( including abstraction from other facilities) and medications  doses and developing a  long term treatment plan and documenting his care.   Please refer to Patient Instructions for Blood Glucose Monitoring and Insulin/Medications Dosing Guide"  in media tab for additional information. Please  also refer to " Patient Self Inventory" in the Media  tab for reviewed elements of pertinent patient history.  Todd Carey participated in the discussions, expressed understanding, and voiced agreement with the above plans.  All questions were answered to his satisfaction.  he is encouraged to contact clinic should he have any questions or concerns prior to his return visit.    Follow up plan: - Return in about 3 months (around 05/24/2020) for Diabetes follow up with A1c in office, No previsit labs, Bring glucometer and logs.  Rayetta Pigg, [facility] [address] [place],  [address] Phone: [phone] Fax: [phone]  02/24/2020, 1:43 PM

## 2020-02-25 ENCOUNTER — Ambulatory Visit: Payer: 59 | Admitting: Nurse Practitioner

## 2020-03-24 ENCOUNTER — Other Ambulatory Visit: Payer: Self-pay | Admitting: Nurse Practitioner

## 2020-05-24 ENCOUNTER — Ambulatory Visit: Payer: 59 | Admitting: Nurse Practitioner

## 2020-06-01 LAB — HEMOGLOBIN A1C: Hemoglobin A1C: 9.6

## 2020-06-01 LAB — LIPID PANEL
Cholesterol: 97 (ref 0–200)
HDL: 41 (ref 35–70)
LDL Cholesterol: 35
LDl/HDL Ratio: 2.4
Triglycerides: 128 (ref 40–160)

## 2020-06-01 LAB — BASIC METABOLIC PANEL
BUN: 16 (ref 4–21)
CO2: 24 — AB (ref 13–22)
Chloride: 105 (ref 99–108)
Creatinine: 1.2 (ref 0.6–1.3)
Glucose: 247
Potassium: 4.7 (ref 3.4–5.3)
Sodium: 138 (ref 137–147)

## 2020-06-01 LAB — COMPREHENSIVE METABOLIC PANEL: Calcium: 9.7 (ref 8.7–10.7)

## 2020-06-03 ENCOUNTER — Ambulatory Visit: Payer: 59 | Admitting: Nurse Practitioner

## 2020-06-03 ENCOUNTER — Encounter: Payer: Self-pay | Admitting: Nurse Practitioner

## 2020-06-03 ENCOUNTER — Other Ambulatory Visit: Payer: Self-pay

## 2020-06-03 VITALS — Ht 70.0 in | Wt 231.0 lb

## 2020-06-03 DIAGNOSIS — E782 Mixed hyperlipidemia: Secondary | ICD-10-CM | POA: Diagnosis not present

## 2020-06-03 DIAGNOSIS — E1165 Type 2 diabetes mellitus with hyperglycemia: Secondary | ICD-10-CM | POA: Diagnosis not present

## 2020-06-03 DIAGNOSIS — E559 Vitamin D deficiency, unspecified: Secondary | ICD-10-CM | POA: Diagnosis not present

## 2020-06-03 DIAGNOSIS — I10 Essential (primary) hypertension: Secondary | ICD-10-CM

## 2020-06-03 MED ORDER — TOUJEO MAX SOLOSTAR 300 UNIT/ML ~~LOC~~ SOPN: 110.0000 [IU] | PEN_INJECTOR | Freq: Every day | SUBCUTANEOUS | 2 refills | Status: DC

## 2020-06-03 MED ORDER — OZEMPIC (0.25 OR 0.5 MG/DOSE) 2 MG/1.5ML ~~LOC~~ SOPN: 0.5000 mg | PEN_INJECTOR | SUBCUTANEOUS | 3 refills | Status: DC

## 2020-06-03 NOTE — Progress Notes (Signed)
06/03/2020, 1:42 PM       Endocrinology follow-up note    Subjective:    Patient ID: Todd Carey, male    DOB: [birth year].   Todd Carey is being seen in follow-up for management of currently uncontrolled symptomatic type 2 diabetes, hyperlipidemia, hypertension. PMD:  Dairl Ponder, MD.   Past Medical History:  Diagnosis Date  . Arthritis    Right shoulder   . Diabetes mellitus   . Hypercholesteremia    Past Surgical History:  Procedure Laterality Date  . COLONOSCOPY    . COLONOSCOPY N/A 02/08/2016   Procedure: COLONOSCOPY;  Surgeon: Danie Binder, MD;  Location: [facility];  Service: Endoscopy;  Laterality: N/A;  10:00 Am   Social History   Socioeconomic History  . Marital status: Married    Spouse name: Not on file  . Number of children: Not on file  . Years of education: Not on file  . Highest education level: Not on file  Occupational History  . Not on file  Tobacco Use  . Smoking status: Former Smoker    Packs/day: 1.00    Years: 38.00    Pack years: 38.00    Types: Cigarettes    Quit date: 04/18/2013    Years since quitting: 7.1  . Smokeless tobacco: Former Network engineer  . Vaping Use: Never used  Substance and Sexual Activity  . Alcohol use: No  . Drug use: No  . Sexual activity: Not on file  Other Topics Concern  . Not on file  Social History Narrative  . Not on file   Social Determinants of Health   Financial Resource Strain: Not on file  Food Insecurity: Not on file  Transportation Needs: Not on file  Physical Activity: Not on file  Stress: Not on file  Social Connections: Not on file   Outpatient Encounter Medications as of 06/03/2020  Medication Sig  . Semaglutide,0.25 or 0.5MG /DOS, (OZEMPIC, 0.25 OR 0.5 MG/DOSE,) 2 MG/1.5ML SOPN Inject 0.5 mg into the skin once a week.  Marland Kitchen amLODipine (NORVASC) 5 MG tablet Take 5 mg by mouth daily.  Marland Kitchen aspirin 81 MG tablet Take 81 mg by mouth daily.  Marland Kitchen  atorvastatin (LIPITOR) 40 MG tablet Take 40 mg by mouth daily.  . Cholecalciferol (VITAMIN D) 2000 units tablet Take 2,000 Units by mouth daily.  . Continuous Blood Gluc Sensor (FREESTYLE LIBRE 14 DAY SENSOR) MISC INJECT 1 EACH INTO THE SKIN EVERY 14 (FOURTEEN) DAYS. USE AS DIRECTED.  . fish oil-omega-3 fatty acids 1000 MG capsule Take 3 g by mouth daily.  Marland Kitchen glipiZIDE (GLUCOTROL XL) 5 MG 24 hr tablet Take 1 tablet (5 mg total) by mouth daily with breakfast. (Patient not taking: Reported on 06/03/2020)  . insulin glargine, 2 Unit Dial, (TOUJEO MAX SOLOSTAR) 300 UNIT/ML Solostar Pen Inject 110 Units into the skin at bedtime.  . insulin lispro (HUMALOG) 100 UNIT/ML KiwkPen Inject 25-31 Units into the skin 3 (three) times daily before meals.  . Insulin Pen Needle 32G X 6 MM MISC SMARTSIG:1 Needle SUB-Q Twice Daily  . losartan (COZAAR) 100 MG tablet Take 100 mg by mouth daily.  . metFORMIN (GLUCOPHAGE) 1000 MG tablet Take 1,000 mg by mouth 2 (two) times daily with a meal.  . [DISCONTINUED] insulin glargine, 2 Unit Dial, (TOUJEO MAX SOLOSTAR) 300 UNIT/ML Solostar Pen Inject 100 Units into the skin at bedtime.   No facility-administered encounter medications on file as  of 06/03/2020.    ALLERGIES: No Known Allergies  VACCINATION STATUS:  There is no immunization history on file for this patient.  Diabetes He presents for his follow-up diabetic visit. He has type 2 diabetes mellitus. Onset time: He was diagnosed at approximate age of 30 years. His disease course has been improving. There are no hypoglycemic associated symptoms. Pertinent negatives for hypoglycemia include no confusion, headaches, pallor or seizures. Associated symptoms include fatigue. Pertinent negatives for diabetes include no blurred vision, no chest pain, no polydipsia, no polyphagia, no polyuria and no weakness. There are no hypoglycemic complications. Symptoms are improving. There are no diabetic complications. Risk factors for  coronary artery disease include diabetes mellitus, dyslipidemia, family history, male sex, hypertension, obesity, sedentary lifestyle and tobacco exposure. Current diabetic treatment includes intensive insulin program and oral agent (monotherapy) (Stopped taking his Glipizide and Trulicity since last visit). He is compliant with treatment some of the time. His weight is fluctuating minimally. He is following a generally unhealthy diet. When asked about meal planning, he reported none. He has not had a previous visit with a dietitian. He participates in exercise daily (walks at the mall daily). His breakfast blood glucose range is generally >200 mg/dl. His lunch blood glucose range is generally >200 mg/dl. His dinner blood glucose range is generally >200 mg/dl. His bedtime blood glucose range is generally >200 mg/dl. (He presents today with his CGM showing above target fasting and postprandial glycemic profile.  His previsit A1c was 9.6%, improving slightly from last visit of 9.8%.  He stopped taking his Trulicity since last visit because he saw an increase in his glucose readings after taking it.  He also stopped taking the Glipizide since last visit due to weight gain.  Analysis of his CGM shows TIR 30%, TAR 70%, TBR 0%.  There are no episodes of hypoglycemia.) An ACE inhibitor/angiotensin II receptor blocker is being taken. He does not see a podiatrist.Eye exam is current.  Hyperlipidemia This is a chronic problem. The current episode started more than 1 year ago. The problem is controlled. Recent lipid tests were reviewed and are normal. Exacerbating diseases include chronic renal disease, diabetes and obesity. Factors aggravating his hyperlipidemia include fatty foods. Pertinent negatives include no chest pain, myalgias or shortness of breath. Current antihyperlipidemic treatment includes statins. The current treatment provides moderate improvement of lipids. There are no compliance problems.  Risk factors for  coronary artery disease include dyslipidemia, diabetes mellitus, family history, male sex, obesity, hypertension and a sedentary lifestyle.  Hypertension This is a chronic problem. The current episode started more than 1 year ago. The problem has been gradually improving since onset. The problem is controlled. Pertinent negatives include no blurred vision, chest pain, headaches, neck pain, palpitations or shortness of breath. There are no associated agents to hypertension. Risk factors for coronary artery disease include dyslipidemia, diabetes mellitus, obesity, male gender, smoking/tobacco exposure and sedentary lifestyle. Past treatments include angiotensin blockers and calcium channel blockers. The current treatment provides moderate improvement. There are no compliance problems.  Hypertensive end-organ damage includes kidney disease. Identifiable causes of hypertension include chronic renal disease.    Review of systems  Constitutional: + Minimally fluctuating body weight,  current Body mass index is 33.15 kg/m. , no fatigue, no subjective hyperthermia, no subjective hypothermia Eyes: no blurry vision, no xerophthalmia ENT: no sore throat, no nodules palpated in throat, no dysphagia/odynophagia, no hoarseness Cardiovascular: no chest pain, no shortness of breath, no palpitations, no leg swelling Respiratory: no cough, no  shortness of breath Gastrointestinal: no nausea/vomiting/diarrhea Musculoskeletal: no muscle/joint aches Skin: no rashes, no hyperemia Neurological: no tremors, no numbness, no tingling, no dizziness Psychiatric: no depression, no anxiety   Objective:    Ht 5\' 10"  (1.778 m)   Wt 231 lb (104.8 kg)   BMI 33.15 kg/m   Wt Readings from Last 3 Encounters:  06/03/20 231 lb (104.8 kg)  02/24/20 227 lb 12.8 oz (103.3 kg)  10/22/19 227 lb 6.4 oz (103.1 kg)    BP Readings from Last 3 Encounters:  02/24/20 138/75  10/22/19 138/74  06/17/19 137/76     Physical Exam-  Limited  Constitutional:  Body mass index is 33.15 kg/m. , not in acute distress, normal state of mind Eyes:  EOMI, no exophthalmos Neck: Supple Cardiovascular: RRR, no murmers, rubs, or gallops, no edema Respiratory: Adequate breathing efforts, no crackles, rales, rhonchi, or wheezing Musculoskeletal: no gross deformities, strength intact in all four extremities, no gross restriction of joint movements Skin:  no rashes, no hyperemia Neurological: no tremor with outstretched hands    Recent Results (from the past 2160 hour(s))  Basic metabolic panel     Status: Abnormal   Collection Time: 06/01/20 12:00 AM  Result Value Ref Range   Glucose 247    BUN 16 4 - 21   CO2 24 (A) 13 - 22   Creatinine 1.2 0.6 - 1.3   Potassium 4.7 3.4 - 5.3   Sodium 138 137 - 147   Chloride 105 99 - 108  Comprehensive metabolic panel     Status: None   Collection Time: 06/01/20 12:00 AM  Result Value Ref Range   Calcium 9.7 8.7 - 10.7  Lipid panel     Status: None   Collection Time: 06/01/20 12:00 AM  Result Value Ref Range   LDl/HDL Ratio 2.4    Triglycerides 128 40 - 160   Cholesterol 97 0 - 200   HDL 41 35 - 70   LDL Cholesterol 35   Hemoglobin A1c     Status: None   Collection Time: 06/01/20 12:00 AM  Result Value Ref Range   Hemoglobin A1C 9.6    Recent Results (from the past 2160 hour(s))  Basic metabolic panel     Status: Abnormal   Collection Time: 06/01/20 12:00 AM  Result Value Ref Range   Glucose 247    BUN 16 4 - 21   CO2 24 (A) 13 - 22   Creatinine 1.2 0.6 - 1.3   Potassium 4.7 3.4 - 5.3   Sodium 138 137 - 147   Chloride 105 99 - 108  Comprehensive metabolic panel     Status: None   Collection Time: 06/01/20 12:00 AM  Result Value Ref Range   Calcium 9.7 8.7 - 10.7  Lipid panel     Status: None   Collection Time: 06/01/20 12:00 AM  Result Value Ref Range   LDl/HDL Ratio 2.4    Triglycerides 128 40 - 160   Cholesterol 97 0 - 200   HDL 41 35 - 70   LDL Cholesterol 35    Hemoglobin A1c     Status: None   Collection Time: 06/01/20 12:00 AM  Result Value Ref Range   Hemoglobin A1C 9.6     Assessment & Plan:   1) Uncontrolled type 2 diabetes mellitus with hyperglycemia (HCC)  - Todd Carey has currently uncontrolled symptomatic type 2 DM since 65 years of age.  He presents today with his  CGM showing above target fasting and postprandial glycemic profile.  His previsit A1c was 9.6%, improving slightly from last visit of 9.8%.  He stopped taking his Trulicity since last visit because he saw an increase in his glucose readings after taking it.  He also stopped taking the Glipizide since last visit due to weight gain.  Analysis of his CGM shows TIR 30%, TAR 70%, TBR 0%.  There are no episodes of hypoglycemia.  - Recent labs reviewed.  -his diabetes is complicated by obesity/sedentary life, and Todd Carey remains at a high risk for more acute and chronic complications which include CAD, CVA, CKD, retinopathy, and neuropathy. These are all discussed in detail with the patient.  - Nutritional counseling repeated at each appointment due to patients tendency to fall back in to old habits.  - The patient admits there is a room for improvement in their diet and drink choices. -  Suggestion is made for the patient to avoid simple carbohydrates from their diet including Cakes, Sweet Desserts / Pastries, Ice Cream, Soda (diet and regular), Sweet Tea, Candies, Chips, Cookies, Sweet Pastries,  Store Bought Juices, Alcohol in Excess of  1-2 drinks a day, Artificial Sweeteners, Coffee Creamer, and "Sugar-free" Products. This will help patient to have stable blood glucose profile and potentially avoid unintended weight gain.   - I encouraged the patient to switch to  unprocessed or minimally processed complex starch and increased protein intake (animal or plant source), fruits, and vegetables.   - Patient is advised to stick to a routine mealtimes to eat 3 meals  a day  and avoid unnecessary snacks ( to snack only to correct hypoglycemia).  -He follows with a dietitian in [place].  - I have approached him with the following individualized plan to manage diabetes and patient agrees:   -He will continue to require intensive treatment with basal/bolus insulin in order for him to maintain control of diabetes to target.  -He will tolerate slight increase with his Toujeo to 110 units SQ daily at bedtime.  He is to continue current dose of Humalog 25-31 units TID with meals if glucose is above 90 and he is eating.  Specific instructions on how to titrate insulin dose based on glucose readings given to patient in writing. -He is to continue Metformin 1000 mg po twice daily with meals.   -He wants to try the Ozempic today.  I discussed and initiated Ozempic 0.25 mg SQ weekly x 2 weeks, then advance to 0.5 mg SQ weekly thereafter if tolerated well.  Samples provided from office today and Rx sent in to pharmacy.   -He is encouraged to continue using CGM to monitor blood glucose at least 4 times per day, before meals and at bedtime, and call the clinic for readings less than 70 or greater than 200 for 3 tests in a row.  -If he continues to need high doses of insulin (200+ units per day), will need to consider switching to U-500 insulin for better absorption.  We discussed this today, however given his abundant supply of current insulin, will continue using basal/bolus approach until he depletes his current supply.  - Patient specific target  A1c;  LDL, HDL, Triglycerides, and  Waist Circumference were discussed in detail.  2) BP/HTN:  His blood pressure is controlled to target.  He is advised to continue Losartan 100 mg po daily and Amlodipine 5 mg po daily.  3) Lipids/HPL:  His most recent lipid panel from 06/01/20 shows controlled  LDL at 34 and Triglycerides of 128.  He is advised to continue Atorvastatin 40 mg po daily at bedtime, and Fish Oil 3g po daily.    4)   Weight/Diet:  His Body mass index is 33.15 kg/m.-clearly complicating the care of his diabetes.  He is a candidate for modest weight loss.  CDE Consult will be initiated , exercise, and detailed carbohydrates information provided.  5) Chronic Care/Health Maintenance: -he is on ACEI/ARB and Statin medications and is encouraged to continue to follow up with Ophthalmology, Dentist,  Podiatrist at least yearly or according to recommendations, and advised to  stay away from smoking. I have recommended yearly flu vaccine and pneumonia vaccination at least every 5 years; moderate intensity exercise for up to 150 minutes weekly; and  sleep for at least 7 hours a day.  - I advised patient to maintain close follow up with Dairl Ponder, MD for primary care needs.    I spent 45 minutes in the care of the patient today including review of labs from [place], Lipids, Thyroid Function, Hematology (current and previous including abstractions from other facilities); face-to-face time discussing  his blood glucose readings/logs, discussing hypoglycemia and hyperglycemia episodes and symptoms, medications doses, his options of short and long term treatment based on the latest standards of care / guidelines;  discussion about incorporating lifestyle medicine;  and documenting the encounter.    Please refer to Patient Instructions for Blood Glucose Monitoring and Insulin/Medications Dosing Guide"  in media tab for additional information. Please  also refer to " Patient Self Inventory" in the Media  tab for reviewed elements of pertinent patient history.  Todd Carey participated in the discussions, expressed understanding, and voiced agreement with the above plans.  All questions were answered to his satisfaction. he is encouraged to contact clinic should he have any questions or concerns prior to his return visit.    Follow up plan: - Return in about 4 months (around 10/04/2020) for Diabetes F/U with A1c in  office, Bring meter and logs, No previsit labs.  Rayetta Pigg, [facility] [address] [place], [place] [address] Phone: [phone] Fax: [phone]  06/03/2020, 1:42 PM

## 2020-06-03 NOTE — Patient Instructions (Signed)

## 2020-06-30 ENCOUNTER — Other Ambulatory Visit: Payer: Self-pay | Admitting: Nurse Practitioner

## 2020-09-05 ENCOUNTER — Other Ambulatory Visit: Payer: Self-pay | Admitting: Nurse Practitioner

## 2020-10-05 LAB — BASIC METABOLIC PANEL
BUN: 17 (ref 4–21)
CO2: 25 — AB (ref 13–22)
Chloride: 105 (ref 99–108)
Creatinine: 1.1 (ref <=1.3)
Glucose: 163
Potassium: 4.8 (ref 3.4–5.3)
Sodium: 139 (ref 137–147)

## 2020-10-05 LAB — COMPREHENSIVE METABOLIC PANEL
Albumin: 4.2 (ref 3.5–5.0)
Calcium: 9.2 (ref 8.7–10.7)
Globulin: 3.2

## 2020-10-05 LAB — TSH: TSH: 0.94 (ref <=5.90)

## 2020-10-05 LAB — HEPATIC FUNCTION PANEL
ALT: 30 (ref 10–40)
AST: 22 (ref 14–40)
Alkaline Phosphatase: 59 (ref 25–125)
Bilirubin, Total: 0.6

## 2020-10-06 LAB — LIPID PANEL
Cholesterol: 160 (ref 0–200)
HDL: 45 (ref 35–70)
LDL Cholesterol: 87
Triglycerides: 190 — AB (ref 40–160)

## 2020-10-07 ENCOUNTER — Ambulatory Visit: Payer: 59 | Admitting: Nurse Practitioner

## 2020-10-07 ENCOUNTER — Encounter: Payer: Self-pay | Admitting: Nurse Practitioner

## 2020-10-07 ENCOUNTER — Other Ambulatory Visit: Payer: Self-pay

## 2020-10-07 VITALS — BP 129/65 | HR 76 | Ht 70.0 in | Wt 224.4 lb

## 2020-10-07 DIAGNOSIS — E559 Vitamin D deficiency, unspecified: Secondary | ICD-10-CM

## 2020-10-07 DIAGNOSIS — I10 Essential (primary) hypertension: Secondary | ICD-10-CM

## 2020-10-07 DIAGNOSIS — E782 Mixed hyperlipidemia: Secondary | ICD-10-CM

## 2020-10-07 DIAGNOSIS — E1165 Type 2 diabetes mellitus with hyperglycemia: Secondary | ICD-10-CM | POA: Diagnosis not present

## 2020-10-07 LAB — POCT UA - MICROALBUMIN
Creatinine, POC: 200 mg/dL
Microalbumin Ur, POC: 80 mg/L

## 2020-10-07 MED ORDER — OZEMPIC (0.25 OR 0.5 MG/DOSE) 2 MG/1.5ML ~~LOC~~ SOPN: 0.5000 mg | PEN_INJECTOR | SUBCUTANEOUS | 0 refills | Status: DC

## 2020-10-07 NOTE — Patient Instructions (Signed)

## 2020-10-07 NOTE — Progress Notes (Signed)
10/07/2020, 1:29 PM       Endocrinology follow-up note    Subjective:    Patient ID: Todd Carey, male    DOB: December 25, 1955.   Todd Carey is being seen in follow-up for management of currently uncontrolled symptomatic type 2 diabetes, hyperlipidemia, hypertension. PMD:  Dairl Ponder, MD.   Past Medical History:  Diagnosis Date   Arthritis    Right shoulder    Diabetes mellitus    Hypercholesteremia    Past Surgical History:  Procedure Laterality Date   COLONOSCOPY     COLONOSCOPY N/A 02/08/2016   Procedure: COLONOSCOPY;  Surgeon: Danie Binder, MD;  Location: [facility];  Service: Endoscopy;  Laterality: N/A;  10:00 Am   Social History   Socioeconomic History   Marital status: Married    Spouse name: Not on file   Number of children: Not on file   Years of education: Not on file   Highest education level: Not on file  Occupational History   Not on file  Tobacco Use   Smoking status: Former    Packs/day: 1.00    Years: 38.00    Pack years: 38.00    Types: Cigarettes    Quit date: 04/18/2013    Years since quitting: 7.4   Smokeless tobacco: Former  Scientific laboratory technician Use: Never used  Substance and Sexual Activity   Alcohol use: No   Drug use: No   Sexual activity: Not on file  Other Topics Concern   Not on file  Social History Narrative   Not on file   Social Determinants of Health   Financial Resource Strain: Not on file  Food Insecurity: Not on file  Transportation Needs: Not on file  Physical Activity: Not on file  Stress: Not on file  Social Connections: Not on file   Outpatient Encounter Medications as of 10/07/2020  Medication Sig   amLODipine (NORVASC) 5 MG tablet Take 5 mg by mouth daily.   aspirin 81 MG tablet Take 81 mg by mouth daily.   atorvastatin (LIPITOR) 40 MG tablet Take 40 mg by mouth daily.   Cholecalciferol (VITAMIN D) 2000 units tablet Take 2,000 Units by mouth daily.   Continuous Blood  Gluc Sensor (FREESTYLE LIBRE 14 DAY SENSOR) MISC INJECT 1 EACH INTO THE SKIN EVERY 14 (FOURTEEN) DAYS. USE AS DIRECTED.   fish oil-omega-3 fatty acids 1000 MG capsule Take 3 g by mouth daily.   insulin glargine, 2 Unit Dial, (TOUJEO MAX SOLOSTAR) 300 UNIT/ML Solostar Pen Inject 110 Units into the skin at bedtime.   insulin lispro (HUMALOG) 100 UNIT/ML KiwkPen Inject 20-26 Units into the skin 3 (three) times daily before meals.   Insulin Pen Needle 32G X 6 MM MISC SMARTSIG:1 Needle SUB-Q Twice Daily   losartan (COZAAR) 100 MG tablet Take 100 mg by mouth daily.   [DISCONTINUED] OZEMPIC, 0.25 OR 0.5 MG/DOSE, 2 MG/1.5ML SOPN INJECT 0.5 MG INTO THE SKIN ONCE A WEEK.   Semaglutide,0.25 or 0.'5MG'$ /DOS, (OZEMPIC, 0.25 OR 0.5 MG/DOSE,) 2 MG/1.5ML SOPN Inject 0.5 mg into the skin once a week.   [DISCONTINUED] glipiZIDE (GLUCOTROL XL) 5 MG 24 hr tablet Take 1 tablet (5 mg total) by mouth daily with breakfast. (Patient not taking: No sig reported)   [DISCONTINUED] metFORMIN (GLUCOPHAGE) 1000 MG tablet Take 1,000 mg by mouth 2 (two) times daily with a meal. (Patient not taking: Reported on 10/07/2020)   No facility-administered encounter medications  on file as of 10/07/2020.    ALLERGIES: No Known Allergies  VACCINATION STATUS:  There is no immunization history on file for this patient.  Diabetes He presents for his follow-up diabetic visit. He has type 2 diabetes mellitus. Onset time: He was diagnosed at approximate age of 38 years. His disease course has been improving. There are no hypoglycemic associated symptoms. Pertinent negatives for hypoglycemia include no confusion, headaches, pallor or seizures. Associated symptoms include fatigue. Pertinent negatives for diabetes include no blurred vision, no chest pain, no polydipsia, no polyphagia, no polyuria and no weakness. There are no hypoglycemic complications. Symptoms are improving. There are no diabetic complications. Risk factors for coronary artery  disease include diabetes mellitus, dyslipidemia, family history, male sex, hypertension, obesity, sedentary lifestyle and tobacco exposure. Current diabetic treatment includes intensive insulin program and oral agent (monotherapy). He is compliant with treatment most of the time. His weight is decreasing steadily. He is following a generally healthy diet. Meal planning includes avoidance of concentrated sweets. He has not had a previous visit with a dietitian. He participates in exercise daily (walks at the mall daily). His home blood glucose trend is decreasing steadily. His breakfast blood glucose range is generally 130-140 mg/dl. His lunch blood glucose range is generally 110-130 mg/dl. His dinner blood glucose range is generally 130-140 mg/dl. His bedtime blood glucose range is generally 110-130 mg/dl. (He presents today with his CGM and logs showing at target fasting and postprandial glycemic profile.  His previsit A1c checked on Monday was 7%, improving from last visit of 9.6%.  Analysis of his CGM shows TIR 86%, TAR 10%, TBR 4%.  He does have mild afternoon episodes of hypoglycemia noted.) An ACE inhibitor/angiotensin II receptor blocker is being taken. He does not see a podiatrist.Eye exam is current.  Hyperlipidemia This is a chronic problem. The current episode started more than 1 year ago. The problem is controlled. Recent lipid tests were reviewed and are normal. Exacerbating diseases include chronic renal disease, diabetes and obesity. Factors aggravating his hyperlipidemia include fatty foods. Pertinent negatives include no chest pain, myalgias or shortness of breath. Current antihyperlipidemic treatment includes statins. The current treatment provides moderate improvement of lipids. There are no compliance problems.  Risk factors for coronary artery disease include dyslipidemia, diabetes mellitus, family history, male sex, obesity, hypertension and a sedentary lifestyle.  Hypertension This is a  chronic problem. The current episode started more than 1 year ago. The problem has been gradually improving since onset. The problem is controlled. Pertinent negatives include no blurred vision, chest pain, headaches, neck pain, palpitations or shortness of breath. There are no associated agents to hypertension. Risk factors for coronary artery disease include dyslipidemia, diabetes mellitus, obesity, male gender, smoking/tobacco exposure and sedentary lifestyle. Past treatments include angiotensin blockers and calcium channel blockers. The current treatment provides moderate improvement. There are no compliance problems.  Hypertensive end-organ damage includes kidney disease. Identifiable causes of hypertension include chronic renal disease.   Review of systems  Constitutional: + Minimally fluctuating body weight,  current Body mass index is 32.2 kg/m. , no fatigue, no subjective hyperthermia, no subjective hypothermia Eyes: no blurry vision, no xerophthalmia ENT: no sore throat, no nodules palpated in throat, no dysphagia/odynophagia, no hoarseness Cardiovascular: no chest pain, no shortness of breath, no palpitations, no leg swelling Respiratory: no cough, no shortness of breath Gastrointestinal: no nausea/vomiting/diarrhea Musculoskeletal: no muscle/joint aches Skin: no rashes, no hyperemia Neurological: no tremors, no numbness, no tingling, no dizziness Psychiatric: no depression, no  anxiety   Objective:    BP 129/65   Pulse 76   Ht '5\' 10"'$  (1.778 m)   Wt 224 lb 6.4 oz (101.8 kg)   BMI 32.20 kg/m   Wt Readings from Last 3 Encounters:  10/07/20 224 lb 6.4 oz (101.8 kg)  06/03/20 231 lb (104.8 kg)  02/24/20 227 lb 12.8 oz (103.3 kg)    BP Readings from Last 3 Encounters:  10/07/20 129/65  02/24/20 138/75  10/22/19 138/74     Physical Exam- Limited  Constitutional:  Body mass index is 32.2 kg/m. , not in acute distress, normal state of mind Eyes:  EOMI, no  exophthalmos Neck: Supple Cardiovascular: RRR, no murmurs, rubs, or gallops, no edema Respiratory: Adequate breathing efforts, no crackles, rales, rhonchi, or wheezing Musculoskeletal: no gross deformities, strength intact in all four extremities, no gross restriction of joint movements Skin:  no rashes, no hyperemia Neurological: no tremor with outstretched hands    No results found for this or any previous visit (from the past 2160 hour(s)).  No results found for this or any previous visit (from the past 2160 hour(s)).   Assessment & Plan:   1) Uncontrolled type 2 diabetes mellitus with hyperglycemia ([place])  - Todd Carey has currently uncontrolled symptomatic type 2 DM since 65 years of age.  He presents today with his CGM and logs showing at target fasting and postprandial glycemic profile.  His previsit A1c checked on Monday was 7%, improving from last visit of 9.6%.  Analysis of his CGM shows TIR 86%, TAR 10%, TBR 4%.  He does have mild afternoon episodes of hypoglycemia noted.  He started a life 360 program to help with his diabetes management and it is helped him choose a healthier lifestyle.  - Recent labs reviewed.  -his diabetes is complicated by obesity/sedentary life, and Todd Carey remains at a high risk for more acute and chronic complications which include CAD, CVA, CKD, retinopathy, and neuropathy. These are all discussed in detail with the patient.  - Nutritional counseling repeated at each appointment due to patients tendency to fall back in to old habits.  - The patient admits there is a room for improvement in their diet and drink choices. -  Suggestion is made for the patient to avoid simple carbohydrates from their diet including Cakes, Sweet Desserts / Pastries, Ice Cream, Soda (diet and regular), Sweet Tea, Candies, Chips, Cookies, Sweet Pastries, Store Bought Juices, Alcohol in Excess of 1-2 drinks a day, Artificial Sweeteners, Coffee Creamer, and  "Sugar-free" Products. This will help patient to have stable blood glucose profile and potentially avoid unintended weight gain.   - I encouraged the patient to switch to unprocessed or minimally processed complex starch and increased protein intake (animal or plant source), fruits, and vegetables.   - Patient is advised to stick to a routine mealtimes to eat 3 meals a day and avoid unnecessary snacks (to snack only to correct hypoglycemia).  -He follows with a dietitian in [place].  - I have approached him with the following individualized plan to manage diabetes and patient agrees:   -He will continue to require intensive treatment with basal/bolus insulin in order for him to maintain control of diabetes to target.  -He is advised to continue his Toujeo to 110 units SQ daily at bedtime and lower his dose of Humalog to 20-26 units TID with meals if glucose is above 90 and he is eating (Specific instructions on how to titrate insulin dosage  based on glucose readings given to patient in writing).  He stopped taking his Metformin in between visits but is doing well without it, so he is advised to stay off of it for now.  He can continue his Ozempic 0.5 mg SQ weekly for now.  -He is encouraged to continue using CGM to monitor blood glucose at least 4 times per day, before meals and at bedtime, and call the clinic for readings less than 70 or greater than 200 for 3 tests in a row.  - Patient specific target  A1c;  LDL, HDL, Triglycerides, and  Waist Circumference were discussed in detail.  2) BP/HTN:  His blood pressure is controlled to target.  He is advised to continue Losartan 100 mg po daily and Amlodipine 5 mg po daily.  3) Lipids/HPL:  His most recent lipid panel from 06/01/20 shows controlled LDL at 34 and Triglycerides of 128.  He is advised to continue Atorvastatin 40 mg po daily at bedtime, and Fish Oil 3g po daily.  Labs were recently drawn by PCP, will obtain copy for records.  4)   Weight/Diet:  His Body mass index is 32.2 kg/m.-clearly complicating the care of his diabetes.  He is a candidate for modest weight loss.  CDE Consult will be initiated , exercise, and detailed carbohydrates information provided.  5) Chronic Care/Health Maintenance: -he is on ACEI/ARB and Statin medications and is encouraged to continue to follow up with Ophthalmology, Dentist,  Podiatrist at least yearly or according to recommendations, and advised to  stay away from smoking. I have recommended yearly flu vaccine and pneumonia vaccination at least every 5 years; moderate intensity exercise for up to 150 minutes weekly; and  sleep for at least 7 hours a day.  - I advised patient to maintain close follow up with Dairl Ponder, MD for primary care needs.      I spent 30 minutes in the care of the patient today including review of labs from [place], Lipids, Thyroid Function, Hematology (current and previous including abstractions from other facilities); face-to-face time discussing  his blood glucose readings/logs, discussing hypoglycemia and hyperglycemia episodes and symptoms, medications doses, his options of short and long term treatment based on the latest standards of care / guidelines;  discussion about incorporating lifestyle medicine;  and documenting the encounter.    Please refer to Patient Instructions for Blood Glucose Monitoring and Insulin/Medications Dosing Guide"  in media tab for additional information. Please  also refer to " Patient Self Inventory" in the Media  tab for reviewed elements of pertinent patient history.  Todd Carey participated in the discussions, expressed understanding, and voiced agreement with the above plans.  All questions were answered to his satisfaction. he is encouraged to contact clinic should he have any questions or concerns prior to his return visit.    Follow up plan: - Return in about 4 months (around 02/06/2021) for Diabetes F/U with A1c in  office, No previsit labs, Bring meter and logs.   Rayetta Pigg, [facility] [address] [place], [place] [address] Phone: [phone] Fax: [phone]  10/07/2020, 1:29 PM

## 2020-11-29 ENCOUNTER — Other Ambulatory Visit: Payer: Self-pay | Admitting: "Endocrinology

## 2020-12-31 ENCOUNTER — Other Ambulatory Visit: Payer: Self-pay | Admitting: "Endocrinology

## 2021-01-04 ENCOUNTER — Other Ambulatory Visit: Payer: Self-pay | Admitting: Nurse Practitioner

## 2021-01-04 ENCOUNTER — Telehealth: Payer: Self-pay | Admitting: Nurse Practitioner

## 2021-01-04 DIAGNOSIS — E1165 Type 2 diabetes mellitus with hyperglycemia: Secondary | ICD-10-CM

## 2021-01-04 MED ORDER — FREESTYLE LIBRE 2 READER DEVI: 0 refills | Status: DC

## 2021-01-04 MED ORDER — FREESTYLE LIBRE 2 SENSOR MISC: 2 refills | Status: DC

## 2021-01-04 NOTE — Telephone Encounter (Signed)
Patient states that [facility] should have faxed [place] a medicare authorization on Friday for his Libre 14 day sensor. Also, the refill for this was sent in as a [employer] 2. He does not have Libre 2 he has the 14 day sensor. [facility] uses [facility] on [address] in [place]

## 2021-01-04 NOTE — Telephone Encounter (Signed)
Advised pt I had not received anything from [facility] regarding a prior authorization. Also discussed with pt that the libre 14 day would start to become harder to find and we could order the [employer] 2 system for him. Pt voiced understanding, prescriptions for libre 2 receiver and sensors [facility] to [facility].

## 2021-02-01 LAB — CBC AND DIFFERENTIAL
HCT: 44 (ref 41–53)
Hemoglobin: 14.1 (ref 13.5–17.5)
Neutrophils Absolute: 3451
Platelets: 359 (ref 150–399)
WBC: 5.8

## 2021-02-01 LAB — LIPID PANEL
Cholesterol: 114 (ref 0–200)
HDL: 39 (ref 35–70)
LDL Cholesterol: 55
Triglycerides: 121 (ref 40–160)

## 2021-02-01 LAB — COMPREHENSIVE METABOLIC PANEL WITH GFR
Albumin: 4.3 (ref 3.5–5.0)
Calcium: 9.5 (ref 8.7–10.7)
Globulin: 3.5

## 2021-02-01 LAB — BASIC METABOLIC PANEL WITH GFR
BUN: 12 (ref 4–21)
CO2: 28 — AB (ref 13–22)
Chloride: 103 (ref 99–108)
Creatinine: 1.1 (ref <=1.3)
Glucose: 168
Potassium: 4.6 (ref 3.4–5.3)
Sodium: 139 (ref 137–147)

## 2021-02-01 LAB — HEPATIC FUNCTION PANEL
ALT: 24 (ref 10–40)
AST: 20 (ref 14–40)
Alkaline Phosphatase: 68 (ref 25–125)
Bilirubin, Total: 0.5

## 2021-02-01 LAB — HEMOGLOBIN A1C: Hemoglobin A1C: 8.9

## 2021-02-01 LAB — CBC: RBC: 5.17 — AB (ref 3.87–5.11)

## 2021-02-02 ENCOUNTER — Encounter: Payer: Self-pay | Admitting: *Deleted

## 2021-02-02 NOTE — Patient Instructions (Signed)
Advice for Weight Management  -For most of us the best way to lose weight is by diet management. Generally speaking, diet management means consuming less calories intentionally which over time brings about progressive weight loss.  This can be achieved more effectively by restricting carbohydrate consumption to the minimum possible.  So, it is critically important to know your numbers: how much calorie you are consuming and how much calorie you need. More importantly, our carbohydrates sources should be unprocessed or minimally processed complex starch food items.   Sometimes, it is important to balance nutrition by increasing protein intake (animal or plant source), fruits, and vegetables.  -Sticking to a routine mealtime to eat 3 meals a day and avoiding unnecessary snacks is shown to have a big role in weight control. Under normal circumstances, the only time we lose real weight is when we are hungry, so allow hunger to take place- hunger means no food between meal times, only water.  It is not advisable to starve.   -It is better to avoid simple carbohydrates including: Cakes, Sweet Desserts, Ice Cream, Soda (diet and regular), Sweet Tea, Candies, Chips, Cookies, Store Bought Juices, Alcohol in Excess of  1-2 drinks a day, Artificial Sweeteners, Doughnuts, Coffee Creamers, "Sugar-free" Products, etc, etc.  This is not a complete list.....    -Consulting with certified diabetes educators is proven to provide you with the most accurate and current information on diet.  Also, you may be  interested in discussing diet options/exchanges , we can schedule a visit with Todd Carey, RDN, CDE for individualized nutrition education.  -Exercise: If you are able: 30 -60 minutes a day ,4 days a week, or 150 minutes a week.  The longer the better.  Combine stretch, strength, and aerobic activities.  If you were told in the past that you have high risk for cardiovascular diseases, you may seek evaluation by  your heart doctor prior to initiating moderate to intense exercise programs.    

## 2021-02-03 ENCOUNTER — Other Ambulatory Visit: Payer: Self-pay

## 2021-02-03 ENCOUNTER — Ambulatory Visit (INDEPENDENT_AMBULATORY_CARE_PROVIDER_SITE_OTHER): Payer: Medicare Other | Admitting: Nurse Practitioner

## 2021-02-03 ENCOUNTER — Encounter: Payer: Self-pay | Admitting: Nurse Practitioner

## 2021-02-03 VITALS — BP 129/67 | HR 70 | Ht 70.0 in | Wt 227.8 lb

## 2021-02-03 DIAGNOSIS — E782 Mixed hyperlipidemia: Secondary | ICD-10-CM | POA: Diagnosis not present

## 2021-02-03 DIAGNOSIS — E559 Vitamin D deficiency, unspecified: Secondary | ICD-10-CM

## 2021-02-03 DIAGNOSIS — I10 Essential (primary) hypertension: Secondary | ICD-10-CM | POA: Diagnosis not present

## 2021-02-03 DIAGNOSIS — E1165 Type 2 diabetes mellitus with hyperglycemia: Secondary | ICD-10-CM | POA: Diagnosis not present

## 2021-02-03 MED ORDER — SEMAGLUTIDE (1 MG/DOSE) 4 MG/3ML ~~LOC~~ SOPN: 1.0000 mg | PEN_INJECTOR | SUBCUTANEOUS | Status: DC

## 2021-02-03 NOTE — Progress Notes (Signed)
02/03/2021, 1:39 PM       Endocrinology follow-up note    Subjective:    Patient ID: Todd Carey, male    DOB: Jun 17, 1955.   Todd Carey is being seen in follow-up for management of currently uncontrolled symptomatic type 2 diabetes, hyperlipidemia, hypertension. PMD:  Dairl Ponder, MD.   Past Medical History:  Diagnosis Date   Arthritis    Right shoulder    Diabetes mellitus    Hypercholesteremia    Past Surgical History:  Procedure Laterality Date   COLONOSCOPY     COLONOSCOPY N/A 02/08/2016   Procedure: COLONOSCOPY;  Surgeon: Danie Binder, MD;  Location: [facility];  Service: Endoscopy;  Laterality: N/A;  10:00 Am   Social History   Socioeconomic History   Marital status: Married    Spouse name: Not on file   Number of children: Not on file   Years of education: Not on file   Highest education level: Not on file  Occupational History   Not on file  Tobacco Use   Smoking status: Former    Packs/day: 1.00    Years: 38.00    Pack years: 38.00    Types: Cigarettes    Quit date: 04/18/2013    Years since quitting: 7.8   Smokeless tobacco: Former  Scientific laboratory technician Use: Never used  Substance and Sexual Activity   Alcohol use: No   Drug use: No   Sexual activity: Not on file  Other Topics Concern   Not on file  Social History Narrative   Not on file   Social Determinants of Health   Financial Resource Strain: Not on file  Food Insecurity: Not on file  Transportation Needs: Not on file  Physical Activity: Not on file  Stress: Not on file  Social Connections: Not on file   Outpatient Encounter Medications as of 02/03/2021  Medication Sig   amLODipine (NORVASC) 5 MG tablet Take 5 mg by mouth daily.   aspirin 81 MG tablet Take 81 mg by mouth daily.   atorvastatin (LIPITOR) 40 MG tablet Take 40 mg by mouth daily.   Cholecalciferol (VITAMIN D) 2000 units tablet Take 2,000 Units by mouth daily.   Continuous Blood  Gluc Receiver (FREESTYLE LIBRE 2 READER) DEVI USE TO CHECK GLUCOSE AS DIRECTED   Continuous Blood Gluc Sensor (FREESTYLE LIBRE 2 SENSOR) MISC USE TO CHECK GLUCOSE FOUR TIMES DAILY AS DIRECTED. CHANGE SENSOR EVERY 14 DAYS.   fish oil-omega-3 fatty acids 1000 MG capsule Take 3 g by mouth daily.   HUMALOG KWIKPEN 200 UNIT/ML KwikPen Inject 25-31 Units into the skin with breakfast, with lunch, and with evening meal.   insulin glargine, 2 Unit Dial, (TOUJEO MAX SOLOSTAR) 300 UNIT/ML Solostar Pen Inject 110 Units into the skin at bedtime.   Insulin Pen Needle 32G X 6 MM MISC SMARTSIG:1 Needle SUB-Q Twice Daily   losartan (COZAAR) 100 MG tablet Take 100 mg by mouth daily.   Semaglutide, 1 MG/DOSE, 4 MG/3ML SOPN Inject 1 mg as directed once a week.   [DISCONTINUED] Semaglutide,0.25 or 0.5MG /DOS, (OZEMPIC, 0.25 OR 0.5 MG/DOSE,) 2 MG/1.5ML SOPN Inject 0.5 mg into the skin once a week.   [DISCONTINUED] insulin lispro (HUMALOG) 100 UNIT/ML KiwkPen Inject 20-26 Units into the skin 3 (three) times daily before meals. (Patient not taking: Reported on 02/03/2021)   No facility-administered encounter medications on file as of 02/03/2021.    ALLERGIES: No Known Allergies  VACCINATION STATUS: Immunization History  Administered Date(s) Administered   Influenza,inj,Quad PF,6+ Mos 09/05/2018   Moderna Covid-19 Vaccine Bivalent Booster 7yrs & up 11/09/2020   Moderna Sars-Covid-2 Vaccination 04/16/2019, 12/02/2019, 05/25/2020   Zoster Recombinat (Shingrix) 06/20/2018, 08/24/2018    Diabetes He presents for his follow-up diabetic visit. He has type 2 diabetes mellitus. Onset time: He was diagnosed at approximate age of 25 years. His disease course has been fluctuating. There are no hypoglycemic associated symptoms. Pertinent negatives for hypoglycemia include no confusion, headaches, pallor or seizures. Associated symptoms include fatigue. Pertinent negatives for diabetes include no blurred vision, no chest pain, no  polydipsia, no polyphagia, no polyuria and no weakness. There are no hypoglycemic complications. Symptoms are stable. There are no diabetic complications. Risk factors for coronary artery disease include diabetes mellitus, dyslipidemia, family history, male sex, hypertension, obesity, sedentary lifestyle and tobacco exposure. Current diabetic treatment includes intensive insulin program and oral agent (monotherapy). He is compliant with treatment most of the time. His weight is fluctuating minimally. He is following a generally healthy diet. Meal planning includes avoidance of concentrated sweets. He has not had a previous visit with a dietitian. He participates in exercise daily (walks at the mall daily). His home blood glucose trend is decreasing steadily. His breakfast blood glucose range is generally 140-180 mg/dl. His lunch blood glucose range is generally 130-140 mg/dl. His dinner blood glucose range is generally 140-180 mg/dl. His bedtime blood glucose range is generally 140-180 mg/dl. (He presents today with his CGM and logs showing at slightly above target fasting and at goal postprandial glycemic profile.  His previsit A1c checked at his PCP office on 1/3 was 8.9%, increasing from last visit of 7%.  He denies any significant hypoglycemia.  Analysis of his CGM shows TIR 61%; TAR 39%; TBR 0%.) An ACE inhibitor/angiotensin II receptor blocker is being taken. He does not see a podiatrist.Eye exam is current.  Hyperlipidemia This is a chronic problem. The current episode started more than 1 year ago. The problem is controlled. Recent lipid tests were reviewed and are normal. Exacerbating diseases include chronic renal disease, diabetes and obesity. Factors aggravating his hyperlipidemia include fatty foods. Pertinent negatives include no chest pain, myalgias or shortness of breath. Current antihyperlipidemic treatment includes statins. The current treatment provides moderate improvement of lipids. There are  no compliance problems.  Risk factors for coronary artery disease include dyslipidemia, diabetes mellitus, family history, male sex, obesity, hypertension and a sedentary lifestyle.  Hypertension This is a chronic problem. The current episode started more than 1 year ago. The problem has been gradually improving since onset. The problem is controlled. Pertinent negatives include no blurred vision, chest pain, headaches, neck pain, palpitations or shortness of breath. There are no associated agents to hypertension. Risk factors for coronary artery disease include dyslipidemia, diabetes mellitus, obesity, male gender, smoking/tobacco exposure and sedentary lifestyle. Past treatments include angiotensin blockers and calcium channel blockers. The current treatment provides moderate improvement. There are no compliance problems.  Hypertensive end-organ damage includes kidney disease. Identifiable causes of hypertension include chronic renal disease.   Review of systems  Constitutional: + Minimally fluctuating body weight,  current Body mass index is 32.69 kg/m. , no fatigue, no subjective hyperthermia, no subjective hypothermia Eyes: no blurry vision, no xerophthalmia ENT: no sore throat, no nodules palpated in throat, no dysphagia/odynophagia, no hoarseness Cardiovascular: no chest pain, no shortness of breath, no palpitations, no leg swelling Respiratory: no cough, no shortness of breath Gastrointestinal: no nausea/vomiting/diarrhea Musculoskeletal: no  muscle/joint aches Skin: no rashes, no hyperemia Neurological: no tremors, no numbness, no tingling, no dizziness Psychiatric: no depression, no anxiety   Objective:    BP 129/67    Pulse 70    Ht 5\' 10"  (1.778 m)    Wt 227 lb 12.8 oz (103.3 kg)    SpO2 98%    BMI 32.69 kg/m   Wt Readings from Last 3 Encounters:  02/03/21 227 lb 12.8 oz (103.3 kg)  10/07/20 224 lb 6.4 oz (101.8 kg)  06/03/20 231 lb (104.8 kg)    BP Readings from Last 3  Encounters:  02/03/21 129/67  10/07/20 129/65  02/24/20 138/75     Physical Exam- Limited  Constitutional:  Body mass index is 32.69 kg/m. , not in acute distress, normal state of mind Eyes:  EOMI, no exophthalmos Neck: Supple Cardiovascular: RRR, no murmurs, rubs, or gallops, no edema Respiratory: Adequate breathing efforts, no crackles, rales, rhonchi, or wheezing Musculoskeletal: no gross deformities, strength intact in all four extremities, no gross restriction of joint movements Skin:  no rashes, no hyperemia Neurological: no tremor with outstretched hands    Recent Results (from the past 2160 hour(s))  CBC and differential     Status: None   Collection Time: 02/01/21 12:00 AM  Result Value Ref Range   Hemoglobin 14.1 13.5 - 17.5   HCT 44 41 - 53   Neutrophils Absolute 3,451.00    Platelets 359 150 - 399   WBC 5.8   CBC     Status: Abnormal   Collection Time: 02/01/21 12:00 AM  Result Value Ref Range   RBC 5.17 (A) 3.87 - 5.27  Basic metabolic panel     Status: Abnormal   Collection Time: 02/01/21 12:00 AM  Result Value Ref Range   Glucose 168    BUN 12 4 - 21   CO2 28 (A) 13 - 22   Creatinine 1.1 0.6 - 1.3   Potassium 4.6 3.4 - 5.3   Sodium 139 137 - 147   Chloride 103 99 - 108  Comprehensive metabolic panel     Status: None   Collection Time: 02/01/21 12:00 AM  Result Value Ref Range   Globulin 3.5    Calcium 9.5 8.7 - 10.7   Albumin 4.3 3.5 - 5.0  Lipid panel     Status: None   Collection Time: 02/01/21 12:00 AM  Result Value Ref Range   Triglycerides 121 40 - 160   Cholesterol 114 0 - 200   HDL 39 35 - 70   LDL Cholesterol 55   Hepatic function panel     Status: None   Collection Time: 02/01/21 12:00 AM  Result Value Ref Range   Alkaline Phosphatase 68 25 - 125   ALT 24 10 - 40   AST 20 14 - 40   Bilirubin, Total 0.5   Hemoglobin A1c     Status: None   Collection Time: 02/01/21 12:00 AM  Result Value Ref Range   Hemoglobin A1C 8.9      Recent Results (from the past 2160 hour(s))  CBC and differential     Status: None   Collection Time: 02/01/21 12:00 AM  Result Value Ref Range   Hemoglobin 14.1 13.5 - 17.5   HCT 44 41 - 53   Neutrophils Absolute 3,451.00    Platelets 359 150 - 399   WBC 5.8   CBC     Status: Abnormal   Collection Time: 02/01/21 12:00 AM  Result Value Ref Range   RBC 5.17 (A) 3.87 - 6.37  Basic metabolic panel     Status: Abnormal   Collection Time: 02/01/21 12:00 AM  Result Value Ref Range   Glucose 168    BUN 12 4 - 21   CO2 28 (A) 13 - 22   Creatinine 1.1 0.6 - 1.3   Potassium 4.6 3.4 - 5.3   Sodium 139 137 - 147   Chloride 103 99 - 108  Comprehensive metabolic panel     Status: None   Collection Time: 02/01/21 12:00 AM  Result Value Ref Range   Globulin 3.5    Calcium 9.5 8.7 - 10.7   Albumin 4.3 3.5 - 5.0  Lipid panel     Status: None   Collection Time: 02/01/21 12:00 AM  Result Value Ref Range   Triglycerides 121 40 - 160   Cholesterol 114 0 - 200   HDL 39 35 - 70   LDL Cholesterol 55   Hepatic function panel     Status: None   Collection Time: 02/01/21 12:00 AM  Result Value Ref Range   Alkaline Phosphatase 68 25 - 125   ALT 24 10 - 40   AST 20 14 - 40   Bilirubin, Total 0.5   Hemoglobin A1c     Status: None   Collection Time: 02/01/21 12:00 AM  Result Value Ref Range   Hemoglobin A1C 8.9      Assessment & Plan:   1) Uncontrolled type 2 diabetes mellitus with hyperglycemia ([place])  - Todd Carey has currently uncontrolled symptomatic type 2 DM since 66 years of age.  He presents today with his CGM and logs showing at slightly above target fasting and at goal postprandial glycemic profile.  His previsit A1c checked at his PCP office on 1/3 was 8.9%, increasing from last visit of 7%.  He denies any significant hypoglycemia.  Analysis of his CGM shows TIR 61%; TAR 39%; TBR 0%.  - Recent labs reviewed.  -his diabetes is complicated by obesity/sedentary life, and  Todd Carey remains at a high risk for more acute and chronic complications which include CAD, CVA, CKD, retinopathy, and neuropathy. These are all discussed in detail with the patient.  - Nutritional counseling repeated at each appointment due to patients tendency to fall back in to old habits.  - The patient admits there is a room for improvement in their diet and drink choices. -  Suggestion is made for the patient to avoid simple carbohydrates from their diet including Cakes, Sweet Desserts / Pastries, Ice Cream, Soda (diet and regular), Sweet Tea, Candies, Chips, Cookies, Sweet Pastries, Store Bought Juices, Alcohol in Excess of 1-2 drinks a day, Artificial Sweeteners, Coffee Creamer, and "Sugar-free" Products. This will help patient to have stable blood glucose profile and potentially avoid unintended weight gain.   - I encouraged the patient to switch to unprocessed or minimally processed complex starch and increased protein intake (animal or plant source), fruits, and vegetables.   - Patient is advised to stick to a routine mealtimes to eat 3 meals a day and avoid unnecessary snacks (to snack only to correct hypoglycemia).  -He follows with a dietitian in [place].  - I have approached him with the following individualized plan to manage diabetes and patient agrees:   -He will continue to require intensive treatment with basal/bolus insulin in order for him to maintain control of diabetes to target.  -He is advised to continue  his Toujeo to 110 units SQ daily at bedtime and increase his Humalog to 25-31 units TID with meals if glucose is above 90 and he is eating (Specific instructions on how to titrate insulin dosage based on glucose readings given to patient in writing).  I discussed and increased his Ozempic to 1 mg SQ weekly.  New Rx will be sent to [facility].  -He is encouraged to continue using CGM to monitor blood glucose at least 4 times per day, before meals and at  bedtime, and call the clinic for readings less than 70 or greater than 200 for 3 tests in a row.  - Patient specific target  A1c;  LDL, HDL, Triglycerides, and  Waist Circumference were discussed in detail.  2) BP/HTN:  His blood pressure is controlled to target.  He is advised to continue Losartan 100 mg po daily and Amlodipine 5 mg po daily.  3) Lipids/HPL:  His most recent lipid panel from 10/05/20 shows controlled LDL at 87 and Triglycerides of 190.  He is advised to continue Atorvastatin 40 mg po daily at bedtime, and Fish Oil 3g po daily.   4)  Weight/Diet:  His Body mass index is 32.69 kg/m.-clearly complicating the care of his diabetes.  He is a candidate for modest weight loss.  CDE Consult will be initiated , exercise, and detailed carbohydrates information provided.  5) Chronic Care/Health Maintenance: -he is on ACEI/ARB and Statin medications and is encouraged to continue to follow up with Ophthalmology, Dentist,  Podiatrist at least yearly or according to recommendations, and advised to  stay away from smoking. I have recommended yearly flu vaccine and pneumonia vaccination at least every 5 years; moderate intensity exercise for up to 150 minutes weekly; and  sleep for at least 7 hours a day.  - I advised patient to maintain close follow up with Dairl Ponder, MD for primary care needs.     I spent 40 minutes in the care of the patient today including review of labs from [place], Lipids, Thyroid Function, Hematology (current and previous including abstractions from other facilities); face-to-face time discussing  his blood glucose readings/logs, discussing hypoglycemia and hyperglycemia episodes and symptoms, medications doses, his options of short and long term treatment based on the latest standards of care / guidelines;  discussion about incorporating lifestyle medicine;  and documenting the encounter.    Please refer to Patient Instructions for Blood Glucose Monitoring and  Insulin/Medications Dosing Guide"  in media tab for additional information. Please  also refer to " Patient Self Inventory" in the Media  tab for reviewed elements of pertinent patient history.  Todd Carey participated in the discussions, expressed understanding, and voiced agreement with the above plans.  All questions were answered to his satisfaction. he is encouraged to contact clinic should he have any questions or concerns prior to his return visit.    Follow up plan: - Return in about 4 months (around 06/03/2021) for Diabetes F/U with A1c in office, Bring meter and logs, No previsit labs.   Rayetta Pigg, [facility] [address] [place], [place] [address] Phone: [phone] Fax: [phone]  02/03/2021, 1:39 PM

## 2021-02-04 ENCOUNTER — Telehealth: Payer: Self-pay | Admitting: Nurse Practitioner

## 2021-02-04 NOTE — Telephone Encounter (Signed)
Informed pt Todd Carey is here from [facility]

## 2021-02-04 NOTE — Telephone Encounter (Signed)
Pt picked up medication.

## 2021-04-05 ENCOUNTER — Telehealth: Payer: Self-pay | Admitting: Nurse Practitioner

## 2021-04-05 NOTE — Telephone Encounter (Signed)
Pt aware his ozempic is ready for pick up from patient assistance and he is sending his Wife to pick up ?

## 2021-04-05 NOTE — Telephone Encounter (Signed)
Pt picked up his medication ?

## 2021-04-07 ENCOUNTER — Telehealth: Payer: Self-pay | Admitting: Internal Medicine

## 2021-04-07 NOTE — Telephone Encounter (Signed)
PATIENT NEEDS ANOTHER QUESTION SHEET SENT TO THEM  ?

## 2021-04-08 NOTE — Telephone Encounter (Signed)
Spoke to pt, informed him that I put another questionnaire in the mail. Pt voiced understanding.   ?

## 2021-04-23 ENCOUNTER — Other Ambulatory Visit: Payer: Self-pay | Admitting: Nurse Practitioner

## 2021-04-23 DIAGNOSIS — E1165 Type 2 diabetes mellitus with hyperglycemia: Secondary | ICD-10-CM

## 2021-05-03 ENCOUNTER — Encounter: Payer: Self-pay | Admitting: *Deleted

## 2021-05-16 ENCOUNTER — Telehealth: Payer: Self-pay

## 2021-05-16 NOTE — Telephone Encounter (Signed)
Faxed paperwork to [facility] [employer] [facility] at [phone]. For Ozempic '1mg'$ /mL ?

## 2021-06-01 ENCOUNTER — Telehealth: Payer: Self-pay

## 2021-06-01 NOTE — Telephone Encounter (Signed)
Faxed paperwork from [facility] [employer] to [phone] for the patients Ozempic '1mg'$ /mL. ? ?Received a confirmation fax that went through. ?

## 2021-06-06 ENCOUNTER — Ambulatory Visit
Admission: EM | Admit: 2021-06-06 | Discharge: 2021-06-06 | Disposition: A | Discharge status: Home / Self Care | Payer: Medicare Other | Attending: Nurse Practitioner | Admitting: Nurse Practitioner

## 2021-06-06 ENCOUNTER — Ambulatory Visit (INDEPENDENT_AMBULATORY_CARE_PROVIDER_SITE_OTHER): Payer: Medicare Other

## 2021-06-06 DIAGNOSIS — M25561 Pain in right knee: Secondary | ICD-10-CM

## 2021-06-06 DIAGNOSIS — S8392XA Sprain of unspecified site of left knee, initial encounter: Secondary | ICD-10-CM | POA: Diagnosis not present

## 2021-06-06 NOTE — ED Provider Notes (Signed)
?[facility] ? ? ? ?CSN: [id number] ?Arrival date & time: 06/06/21  1209 ? ? ?  ? ?History   ?Chief Complaint ?Chief Complaint  ?Patient presents with  ? Knee Injury  ? ? ?HPI ?Todd Carey is a 66 y.o. male.  ? ?The patient is a 66 year old male who presents for a left knee injury.  Patient states injury occurred earlier this morning when he was pulling a tree stump.  He states he felt his knee pop as it twisted outward.  Since that time he has had pain with weightbearing.  He denies swelling or bruising.  He denies any previous injury to the left knee.  He does have weakness in the knee as he is now walking with a cane.  He denies any numbness, tingling, or radiation of pain.  He is now using a cane to help with ambulation. ? ?The history is provided by the patient and the spouse.  ? ?Past Medical History:  ?Diagnosis Date  ? Arthritis   ? Right shoulder   ? Diabetes mellitus   ? Hypercholesteremia   ? ? ?Patient Active Problem List  ? Diagnosis Date Noted  ? Vitamin D deficiency 11/07/2018  ? Uncontrolled type 2 diabetes mellitus with hyperglycemia ([place]) 07/09/2017  ? Mixed hyperlipidemia 07/09/2017  ? Essential hypertension, benign 07/09/2017  ? Class 1 obesity due to excess calories with serious comorbidity and body mass index (BMI) of 34.0 to 34.9 in adult 07/09/2017  ? Special screening for malignant neoplasms, colon   ? ? ?Past Surgical History:  ?Procedure Laterality Date  ? COLONOSCOPY    ? COLONOSCOPY N/A 02/08/2016  ? Procedure: COLONOSCOPY;  Surgeon: Danie Binder, MD;  [facility];  Service: Endoscopy;  Laterality: N/A;  10:00 Am  ? ? ? ? ? ?Home Medications   ? ?Prior to Admission medications   ?Medication Sig Start Date End Date Taking? Authorizing Provider  ?amLODipine (NORVASC) 5 MG tablet Take 5 mg by mouth daily. 10/20/19   [provider]  ?aspirin 81 MG tablet Take 81 mg by mouth daily.    [provider]  ?atorvastatin (LIPITOR) 40 MG tablet Take 40 mg by  mouth daily.    [provider]  ?Cholecalciferol (VITAMIN D) 2000 units tablet Take 2,000 Units by mouth daily.    [provider]  ?Continuous Blood Gluc Receiver (FREESTYLE LIBRE 2 READER) DEVI USE TO CHECK GLUCOSE AS DIRECTED 01/04/21   Brita Romp, NP  ?Continuous Blood Gluc Sensor (FREESTYLE LIBRE 2 SENSOR) MISC USE TO CHECK GLUCOSE FOUR TIMES DAILY AS DIRECTED. CHANGE SENSOR EVERY 14 DAYS. 04/25/21   Brita Romp, NP  ?fish oil-omega-3 fatty acids 1000 MG capsule Take 3 g by mouth daily.    [provider]  ?Cleda Clarks 200 UNIT/ML KwikPen Inject 25-31 Units into the skin with breakfast, with lunch, and with evening meal. 11/07/20   [provider]  ?insulin glargine, 2 Unit Dial, (TOUJEO MAX SOLOSTAR) 300 UNIT/ML Solostar Pen Inject 110 Units into the skin at bedtime. 06/03/20   Brita Romp, NP  ?Insulin Pen Needle 32G X 6 MM MISC SMARTSIG:1 Needle SUB-Q Twice Daily 10/01/19   [provider]  ?losartan (COZAAR) 100 MG tablet Take 100 mg by mouth daily.    [provider]  ?Semaglutide, 1 MG/DOSE, 4 MG/3ML SOPN Inject 1 mg as directed once a week. 02/03/21   Brita Romp, NP  ? ? ?Family History ?Family History  ?  Problem Relation Age of Onset  ? Hyperlipidemia Mother   ? CAD Father   ? Colon cancer Neg Hx   ? ? ?Social History ?Social History  ? ?Tobacco Use  ? Smoking status: Former  ?  Packs/day: 1.00  ?  Years: 38.00  ?  Pack years: 38.00  ?  Types: Cigarettes  ?  Quit date: 04/18/2013  ?  Years since quitting: 8.1  ? Smokeless tobacco: Former  ?Vaping Use  ? Vaping Use: Never used  ?Substance Use Topics  ? Alcohol use: No  ? Drug use: No  ? ? ? ?Allergies   ?Patient has no known allergies. ? ? ?Review of Systems ?Review of Systems  ?Constitutional: Negative.   ?Musculoskeletal:  Positive for gait problem. Negative for joint swelling.  ?     Left knee pain  ?Skin: Negative.   ?Psychiatric/Behavioral: Negative.    ? ? ?Physical  Exam ?Triage Vital Signs ?ED Triage Vitals  ?Enc Vitals Group  ?   BP 06/06/21 1519 (!) 159/74  ?   Pulse Rate 06/06/21 1519 72  ?   Resp 06/06/21 1519 20  ?   Temp 06/06/21 1519 98 ?F (36.7 ?C)  ?   Temp src --   ?   SpO2 06/06/21 1519 98 %  ?   Weight --   ?   Height --   ?   Head Circumference --   ?   Peak Flow --   ?   Pain Score 06/06/21 1518 7  ?   Pain Loc --   ?   Pain Edu? --   ?   Excl. in [place]? --   ? ?No data found. ? ?Updated Vital Signs ?BP (!) 159/74   Pulse 72   Temp 98 ?F (36.7 ?C)   Resp 20   SpO2 98%  ? ?Visual Acuity ?Right Eye Distance:   ?Left Eye Distance:   ?Bilateral Distance:   ? ?Right Eye Near:   ?Left Eye Near:    ?Bilateral Near:    ? ?Physical Exam ?Vitals reviewed.  ?Constitutional:   ?   Appearance: Normal appearance.  ?HENT:  ?   Head: Normocephalic.  ?Pulmonary:  ?   Effort: Pulmonary effort is normal.  ?Abdominal:  ?   General: Bowel sounds are normal.  ?   Palpations: Abdomen is soft.  ?Musculoskeletal:  ?   Left knee: No swelling, deformity or bony tenderness. Decreased range of motion. Tenderness present over the PCL.  ?Skin: ?   General: Skin is warm and dry.  ?   Capillary Refill: Capillary refill takes less than 2 seconds.  ?Neurological:  ?   General: No focal deficit present.  ?   Mental Status: He is alert and oriented to person, place, and time.  ?Psychiatric:     ?   Mood and Affect: Mood normal.     ?   Behavior: Behavior normal.  ? ? ? ?UC Treatments / Results  ?Labs ?(all labs ordered are listed, but only abnormal results are displayed) ?Labs Reviewed - No data to display ? ?EKG ? ? ?Radiology ?DG Knee Complete 4 Views Right ? ?Result Date: 06/06/2021 ?CLINICAL DATA:  Trauma, pain EXAM: RIGHT KNEE - COMPLETE 4+ VIEW COMPARISON:  None Available. FINDINGS: No recent fracture or dislocation is seen in the right knee. In the lateral view, there is a faint 3 mm calcific density in the anterior aspect of the knee which could not be seen in  the rest of the images. There is  soft tissue fullness in the suprapatellar bursa. Small bony spurs are noted in the medial and patellofemoral compartments. IMPRESSION: No fracture or dislocation is seen in the right knee. Small effusion is seen in the suprapatellar bursa. Small bony spurs are noted in medial and patellofemoral compartments. Electronically Signed   By: Elmer Picker M.D.   On: 06/06/2021 15:35   ? ?Procedures ?Procedures (including critical care time) ? ?Medications Ordered in UC ?Medications - No data to display ? ?Initial Impression / Assessment and Plan / UC Course  ?I have reviewed the triage vital signs and the nursing notes. ? ?Pertinent labs & imaging results that were available during my care of the patient were reviewed by me and considered in my medical decision making (see chart for details). ? ?The patient is a 66 year old male who presents with left knee pain.  Symptoms occurred after he was pulling a tree stump earlier today when his twisted.  He states he felt a "pop" when the injury occurred.  His exam is reassuring for no fracture or dislocation.  He does have moderate tenderness in the PCL of his knee.  X-rays were negative for fracture or dislocation.  Patient is now ambulating with a cane.  Symptoms are consistent with a left knee sprain; however, cannot rule out ligament or tendon injury based on the x-ray alone.  Patient advised that if his symptoms persist, recommend follow-up with orthopedics.  Patient was offered ibuprofen for pain and inflammation; he declined today.  Patient advised to use RICE therapy, follow-up with orthopedics if symptoms do not improve. ?Final Clinical Impressions(s) / UC Diagnoses  ? ?Final diagnoses:  ?Sprain of left knee, unspecified ligament, initial encounter  ? ? ? ?Discharge Instructions   ? ?  ?Your x-rays were negative for fracture or dislocation. ?If your symptoms continue to worsen, recommend follow-up with Ortho care at [facility].  Information has been provided for  you to contact them. ?RICE therapy, rest, ice, compression, and elevation. ?I am providing you a brace to help with support and stabilization of the knee.  Use when the prolonged activity. ?Follow-up as neede

## 2021-06-06 NOTE — Discharge Instructions (Addendum)
Your x-rays were negative for fracture or dislocation. ?If your symptoms continue to worsen, recommend follow-up with Ortho care at [facility].  Information has been provided for you to contact them. ?RICE therapy, rest, ice, compression, and elevation. ?I am providing you a brace to help with support and stabilization of the knee.  Use when the prolonged activity. ?Follow-up as needed. ?

## 2021-06-06 NOTE — ED Triage Notes (Signed)
Pt presents with c/o right knee pain from twisting injury that occurred this morning. Heard a pop ?

## 2021-06-07 LAB — HEMOGLOBIN A1C: Hemoglobin A1C: 8.5

## 2021-06-07 LAB — BASIC METABOLIC PANEL
BUN: 16 (ref 4–21)
Creatinine: 1.2 (ref 0.6–1.3)

## 2021-06-07 LAB — COMPREHENSIVE METABOLIC PANEL: eGFR: 66

## 2021-06-07 LAB — VITAMIN D 25 HYDROXY (VIT D DEFICIENCY, FRACTURES): Vit D, 25-Hydroxy: 36

## 2021-06-09 ENCOUNTER — Encounter: Payer: Self-pay | Admitting: Nurse Practitioner

## 2021-06-09 ENCOUNTER — Ambulatory Visit (INDEPENDENT_AMBULATORY_CARE_PROVIDER_SITE_OTHER): Payer: Medicare Other | Admitting: Nurse Practitioner

## 2021-06-09 VITALS — BP 135/71 | HR 69 | Ht 70.0 in | Wt 228.0 lb

## 2021-06-09 DIAGNOSIS — I10 Essential (primary) hypertension: Secondary | ICD-10-CM | POA: Diagnosis not present

## 2021-06-09 DIAGNOSIS — E1165 Type 2 diabetes mellitus with hyperglycemia: Secondary | ICD-10-CM

## 2021-06-09 DIAGNOSIS — E559 Vitamin D deficiency, unspecified: Secondary | ICD-10-CM

## 2021-06-09 DIAGNOSIS — E782 Mixed hyperlipidemia: Secondary | ICD-10-CM | POA: Diagnosis not present

## 2021-06-09 MED ORDER — SEMAGLUTIDE (2 MG/DOSE) 8 MG/3ML ~~LOC~~ SOPN: 2.0000 mg | PEN_INJECTOR | SUBCUTANEOUS | 3 refills | Status: DC

## 2021-06-09 MED ORDER — TOUJEO MAX SOLOSTAR 300 UNIT/ML ~~LOC~~ SOPN: 100.0000 [IU] | PEN_INJECTOR | Freq: Every day | SUBCUTANEOUS | 2 refills | Status: DC

## 2021-06-09 NOTE — Patient Instructions (Signed)
Diabetes Mellitus and Foot Care Foot care is an important part of your health, especially when you have diabetes. Diabetes may cause you to have problems because of poor blood flow (circulation) to your feet and legs, which can cause your skin to: Become thinner and drier. Break more easily. Heal more slowly. Peel and crack. You may also have nerve damage (neuropathy) in your legs and feet, causing decreased feeling in them. This means that you may not notice minor injuries to your feet that could lead to more serious problems. Noticing and addressing any potential problems early is the best way to prevent future foot problems. How to care for your feet Foot hygiene  Wash your feet daily with warm water and mild soap. Do not use hot water. Then, pat your feet and the areas between your toes until they are completely dry. Do not soak your feet as this can dry your skin. Trim your toenails straight across. Do not dig under them or around the cuticle. File the edges of your nails with an emery board or nail file. Apply a moisturizing lotion or petroleum jelly to the skin on your feet and to dry, brittle toenails. Use lotion that does not contain alcohol and is unscented. Do not apply lotion between your toes. Shoes and socks Wear clean socks or stockings every day. Make sure they are not too tight. Do not wear knee-high stockings since they may decrease blood flow to your legs. Wear shoes that fit properly and have enough cushioning. Always look in your shoes before you put them on to be sure there are no objects inside. To break in new shoes, wear them for just a few hours a day. This prevents injuries on your feet. Wounds, scrapes, corns, and calluses  Check your feet daily for blisters, cuts, bruises, sores, and redness. If you cannot see the bottom of your feet, use a mirror or ask someone for help. Do not cut corns or calluses or try to remove them with medicine. If you find a minor scrape,  cut, or break in the skin on your feet, keep it and the skin around it clean and dry. You may clean these areas with mild soap and water. Do not clean the area with peroxide, alcohol, or iodine. If you have a wound, scrape, corn, or callus on your foot, look at it several times a day to make sure it is healing and not infected. Check for: Redness, swelling, or pain. Fluid or blood. Warmth. Pus or a bad smell. General tips Do not cross your legs. This may decrease blood flow to your feet. Do not use heating pads or hot water bottles on your feet. They may burn your skin. If you have lost feeling in your feet or legs, you may not know this is happening until it is too late. Protect your feet from hot and cold by wearing shoes, such as at the beach or on hot pavement. Schedule a complete foot exam at least once a year (annually) or more often if you have foot problems. Report any cuts, sores, or bruises to your health care provider immediately. Where to find more information American Diabetes Association: www.diabetes.org Association of Diabetes Care & Education Specialists: www.diabeteseducator.org Contact a health care provider if: You have a medical condition that increases your risk of infection and you have any cuts, sores, or bruises on your feet. You have an injury that is not healing. You have redness on your legs or feet. You   feel burning or tingling in your legs or feet. You have pain or cramps in your legs and feet. Your legs or feet are numb. Your feet always feel cold. You have pain around any toenails. Get help right away if: You have a wound, scrape, corn, or callus on your foot and: You have pain, swelling, or redness that gets worse. You have fluid or blood coming from the wound, scrape, corn, or callus. Your wound, scrape, corn, or callus feels warm to the touch. You have pus or a bad smell coming from the wound, scrape, corn, or callus. You have a fever. You have a red  line going up your leg. Summary Check your feet every day for blisters, cuts, bruises, sores, and redness. Apply a moisturizing lotion or petroleum jelly to the skin on your feet and to dry, brittle toenails. Wear shoes that fit properly and have enough cushioning. If you have foot problems, report any cuts, sores, or bruises to your health care provider immediately. Schedule a complete foot exam at least once a year (annually) or more often if you have foot problems. This information is not intended to replace advice given to you by your health care provider. Make sure you discuss any questions you have with your health care provider. Document Revised: 08/07/2019 Document Reviewed: 08/07/2019 Elsevier Patient Education  2023 Elsevier Inc.  

## 2021-06-09 NOTE — Progress Notes (Signed)
? ? ?     06/09/2021, 1:52 PM ? ?     ?Endocrinology follow-up note ? ? ? ?Subjective:  ? ? Patient ID: Todd Carey, male    DOB: 10-Aug-1955.  ? ?Todd Carey is being seen in follow-up for management of currently uncontrolled symptomatic type 2 diabetes, hyperlipidemia, hypertension. ?PMD:  Dairl Ponder, MD. ? ? ?Past Medical History:  ?Diagnosis Date  ? Arthritis   ? Right shoulder   ? Diabetes mellitus   ? Hypercholesteremia   ? ?Past Surgical History:  ?Procedure Laterality Date  ? COLONOSCOPY    ? COLONOSCOPY N/A 02/08/2016  ? Procedure: COLONOSCOPY;  Surgeon: Danie Binder, MD;  [facility];  Service: Endoscopy;  Laterality: N/A;  10:00 Am  ? ?Social History  ? ?Socioeconomic History  ? Marital status: Married  ?  Spouse name: Not on file  ? Number of children: Not on file  ? Years of education: Not on file  ? Highest education level: Not on file  ?Occupational History  ? Not on file  ?Tobacco Use  ? Smoking status: Former  ?  Packs/day: 1.00  ?  Years: 38.00  ?  Pack years: 38.00  ?  Types: Cigarettes  ?  Quit date: 04/18/2013  ?  Years since quitting: 8.1  ? Smokeless tobacco: Former  ?Vaping Use  ? Vaping Use: Never used  ?Substance and Sexual Activity  ? Alcohol use: No  ? Drug use: No  ? Sexual activity: Not on file  ?Other Topics Concern  ? Not on file  ?Social History Narrative  ? Not on file  ? ?Social Determinants of Health  ? ?Financial Resource Strain: Not on file  ?Food Insecurity: Not on file  ?Transportation Needs: Not on file  ?Physical Activity: Not on file  ?Stress: Not on file  ?Social Connections: Not on file  ? ?Outpatient Encounter Medications as of 06/09/2021  ?Medication Sig  ? Semaglutide, 2 MG/DOSE, 8 MG/3ML SOPN Inject 2 mg as directed once a week.  ? amLODipine (NORVASC) 5 MG tablet Take 5 mg by mouth daily.  ? aspirin 81 MG tablet Take 81 mg by mouth daily.  ? atorvastatin (LIPITOR) 40 MG tablet Take 40 mg by mouth daily.  ? Cholecalciferol  (VITAMIN D) 2000 units tablet Take 2,000 Units by mouth daily.  ? Continuous Blood Gluc Receiver (FREESTYLE LIBRE 2 READER) DEVI USE TO CHECK GLUCOSE AS DIRECTED  ? Continuous Blood Gluc Sensor (FREESTYLE LIBRE 2 SENSOR) MISC USE TO CHECK GLUCOSE FOUR TIMES DAILY AS DIRECTED. CHANGE SENSOR EVERY 14 DAYS.  ? fish oil-omega-3 fatty acids 1000 MG capsule Take 3 g by mouth daily.  ? HUMALOG KWIKPEN 200 UNIT/ML KwikPen Inject 25-31 Units into the skin with breakfast, with lunch, and with evening meal.  ? insulin glargine, 2 Unit Dial, (TOUJEO MAX SOLOSTAR) 300 UNIT/ML Solostar Pen Inject 100 Units into the skin at bedtime.  ? Insulin Pen Needle 32G X 6 MM MISC SMARTSIG:1 Needle SUB-Q Twice Daily  ? losartan (COZAAR) 100 MG tablet Take 100 mg by mouth daily.  ? [DISCONTINUED] insulin glargine, 2 Unit Dial, (TOUJEO MAX SOLOSTAR) 300 UNIT/ML Solostar Pen Inject 110 Units into the skin at bedtime.  ? [DISCONTINUED] Semaglutide, 1 MG/DOSE, 4 MG/3ML SOPN Inject 1 mg as directed once a week.  ? ?No facility-administered encounter medications on file as of 06/09/2021.  ? ? ?ALLERGIES: ?No Known Allergies ? ?VACCINATION STATUS: ?Immunization History  ?Administered Date(s) Administered  ?  Influenza,inj,Quad PF,6+ Mos 09/05/2018  ? Moderna Covid-19 Vaccine Bivalent Booster 95yr & up 11/09/2020  ? Moderna Sars-Covid-2 Vaccination 04/16/2019, 12/02/2019, 05/25/2020  ? Zoster Recombinat (Shingrix) 06/20/2018, 08/24/2018  ? ? ?Diabetes ?He presents for his follow-up diabetic visit. He has type 2 diabetes mellitus. Onset time: He was diagnosed at approximate age of 89years. His disease course has been improving. There are no hypoglycemic associated symptoms. Pertinent negatives for hypoglycemia include no confusion, headaches, pallor or seizures. Associated symptoms include fatigue. Pertinent negatives for diabetes include no blurred vision, no chest pain, no polydipsia, no polyphagia, no polyuria and no weakness. There are no  hypoglycemic complications. Symptoms are stable. There are no diabetic complications. Risk factors for coronary artery disease include diabetes mellitus, dyslipidemia, family history, male sex, hypertension, obesity, sedentary lifestyle and tobacco exposure. Current diabetic treatment includes intensive insulin program and oral agent (monotherapy). He is compliant with treatment most of the time. His weight is increasing steadily. He is following a generally healthy diet. Meal planning includes avoidance of concentrated sweets. He has not had a previous visit with a dietitian. He participates in exercise daily (walks at the mall daily). His home blood glucose trend is decreasing steadily. His overall blood glucose range is 180-200 mg/dl. (He presents today with his CGM and logs showing improving glycemic profile overall. His previsit A1c, checked at his PCP office on 06/07/21 was 8.5%, improving from last visit of 8.9%.  Analysis of his CGM shows TIR 49%, TAR 51%, TBR 0% with a GMI of 7.7%.  He denies any hypoglycemia.  He has had no negative side effects with his increase in Ozempic at last visit.) An ACE inhibitor/angiotensin II receptor blocker is being taken. He does not see a podiatrist.Eye exam is current.  ?Hyperlipidemia ?This is a chronic problem. The current episode started more than 1 year ago. The problem is controlled. Recent lipid tests were reviewed and are normal. Exacerbating diseases include chronic renal disease, diabetes and obesity. Factors aggravating his hyperlipidemia include fatty foods. Pertinent negatives include no chest pain, myalgias or shortness of breath. Current antihyperlipidemic treatment includes statins. The current treatment provides moderate improvement of lipids. There are no compliance problems.  Risk factors for coronary artery disease include dyslipidemia, diabetes mellitus, family history, male sex, obesity, hypertension and a sedentary lifestyle.  ?Hypertension ?This is a  chronic problem. The current episode started more than 1 year ago. The problem has been gradually improving since onset. The problem is controlled. Pertinent negatives include no blurred vision, chest pain, headaches, neck pain, palpitations or shortness of breath. There are no associated agents to hypertension. Risk factors for coronary artery disease include dyslipidemia, diabetes mellitus, obesity, male gender, smoking/tobacco exposure and sedentary lifestyle. Past treatments include angiotensin blockers and calcium channel blockers. The current treatment provides moderate improvement. There are no compliance problems.  Hypertensive end-organ damage includes kidney disease. Identifiable causes of hypertension include chronic renal disease.  ? ?Review of systems ? ?Constitutional: + steadily increasing body weight,  current Body mass index is 32.71 kg/m?. , no fatigue, no subjective hyperthermia, no subjective hypothermia ?Eyes: no blurry vision, no xerophthalmia ?ENT: no sore throat, no nodules palpated in throat, no dysphagia/odynophagia, no hoarseness ?Cardiovascular: no chest pain, no shortness of breath, no palpitations, no leg swelling ?Respiratory: no cough, no shortness of breath ?Gastrointestinal: no nausea/vomiting/diarrhea ?Musculoskeletal: no muscle/joint aches ?Skin: no rashes, no hyperemia ?Neurological: no tremors, no numbness, no tingling, no dizziness ?Psychiatric: no depression, no anxiety ? ? ?Objective:  ?  ?  BP 135/71   Pulse 69   Ht '5\' 10"'$  (1.778 m)   Wt 228 lb (103.4 kg)   BMI 32.71 kg/m?   ?Wt Readings from Last 3 Encounters:  ?06/09/21 228 lb (103.4 kg)  ?02/03/21 227 lb 12.8 oz (103.3 kg)  ?10/07/20 224 lb 6.4 oz (101.8 kg)  ?  ?BP Readings from Last 3 Encounters:  ?06/09/21 135/71  ?06/06/21 (!) 159/74  ?02/03/21 129/67  ? ? ? ?Physical Exam- Limited ? ?Constitutional:  Body mass index is 32.71 kg/m?. , not in acute distress, normal state of mind ?Eyes:  EOMI, no exophthalmos ?Neck:  Supple ?Cardiovascular: RRR, no murmurs, rubs, or gallops, no edema ?Respiratory: Adequate breathing efforts, no crackles, rales, rhonchi, or wheezing ?Musculoskeletal: no gross deformities, strength intact in all four

## 2021-06-17 ENCOUNTER — Telehealth: Payer: Self-pay | Admitting: Nurse Practitioner

## 2021-06-17 NOTE — Telephone Encounter (Signed)
Patient picked up ozempic . 

## 2021-06-17 NOTE — Telephone Encounter (Signed)
Patient has patient assistance Ozempic in the fridge. Called and made his wife aware

## 2021-07-04 ENCOUNTER — Ambulatory Visit: Payer: Self-pay

## 2021-07-12 ENCOUNTER — Telehealth: Payer: Self-pay | Admitting: *Deleted

## 2021-07-12 NOTE — Telephone Encounter (Signed)
Referring MD/PCP: Dr. Audery Amel Vasireddy  Procedure: Colonoscopy  Has patient had this procedure before?  Dr. Oneida Alar, 02/08/16  If so, when, by whom and where?    Is there a family history of colon cancer?  no  Who?  What age when diagnosed?    Is patient diabetic? Yes, type 2      Does patient have prosthetic heart valve or mechanical valve?  no  Do you have a pacemaker/defibrillator?  no  Has patient ever had endocarditis/atrial fibrillation? no  Does patient use oxygen? no  Has patient had joint replacement within last 12 months?  no  Is patient constipated or do they take laxatives? no  Does patient have a history of alcohol/drug use?  no  Have you had a stroke/heart attack last 6 mths? no  Do you take medicine for weight loss?  no  Is patient on blood thinner such as Coumadin, Plavix and/or Aspirin? Aspirin '81mg'$   Medications:  Current Outpatient Medications on File Prior to Visit  Medication Sig Dispense Refill   amLODipine (NORVASC) 5 MG tablet Take 5 mg by mouth daily.     aspirin 81 MG tablet Take 81 mg by mouth daily.     atorvastatin (LIPITOR) 40 MG tablet Take 40 mg by mouth daily.     Cholecalciferol (VITAMIN D) 2000 units tablet Take 2,000 Units by mouth daily.     Continuous Blood Gluc Receiver (FREESTYLE LIBRE 2 READER) DEVI USE TO CHECK GLUCOSE AS DIRECTED 1 each 0   Continuous Blood Gluc Sensor (FREESTYLE LIBRE 2 SENSOR) MISC USE TO CHECK GLUCOSE FOUR TIMES DAILY AS DIRECTED. CHANGE SENSOR EVERY 14 DAYS. 2 each 2   fish oil-omega-3 fatty acids 1000 MG capsule Take 3 g by mouth daily.     HUMALOG KWIKPEN 200 UNIT/ML KwikPen Inject 25-31 Units into the skin with breakfast, with lunch, and with evening meal.     insulin glargine, 2 Unit Dial, (TOUJEO MAX SOLOSTAR) 300 UNIT/ML Solostar Pen Inject 100 Units into the skin at bedtime. 30 mL 2   Insulin Pen Needle 32G X 6 MM MISC SMARTSIG:1 Needle SUB-Q Twice Daily     losartan (COZAAR) 100 MG tablet Take 100 mg by  mouth daily.     Semaglutide, 2 MG/DOSE, 8 MG/3ML SOPN Inject 2 mg as directed once a week. 6 mL 3   No current facility-administered medications on file prior to visit.     Allergies: No Known Allergies

## 2021-07-18 ENCOUNTER — Other Ambulatory Visit: Payer: Self-pay | Admitting: Nurse Practitioner

## 2021-07-18 DIAGNOSIS — E1165 Type 2 diabetes mellitus with hyperglycemia: Secondary | ICD-10-CM

## 2021-08-24 ENCOUNTER — Encounter: Payer: Self-pay | Admitting: *Deleted

## 2021-08-24 MED ORDER — PEG 3350-KCL-NA BICARB-NACL 420 G PO SOLR: 4000.0000 mL | Freq: Once | ORAL | 0 refills | Status: AC

## 2021-08-25 ENCOUNTER — Encounter: Payer: Self-pay | Admitting: *Deleted

## 2021-09-13 LAB — HEMOGLOBIN A1C
Hemoglobin A1C: 8.4
Hemoglobin A1C: 8.5

## 2021-09-13 LAB — LIPID PANEL
LDL Cholesterol: 26
Triglycerides: 102 (ref 40–160)

## 2021-09-13 LAB — BASIC METABOLIC PANEL
BUN: 18 (ref 4–21)
Creatinine: 1.2 (ref 0.6–1.3)
Glucose: 170

## 2021-09-13 LAB — TSH: TSH: 0.73 (ref 0.41–5.90)

## 2021-09-13 LAB — CBC AND DIFFERENTIAL
HCT: 39 — AB (ref 41–53)
Hemoglobin: 12.9 — AB (ref 13.5–17.5)

## 2021-09-13 LAB — COMPREHENSIVE METABOLIC PANEL: eGFR: 68

## 2021-09-15 ENCOUNTER — Encounter: Payer: Self-pay | Admitting: Nurse Practitioner

## 2021-09-15 ENCOUNTER — Ambulatory Visit (INDEPENDENT_AMBULATORY_CARE_PROVIDER_SITE_OTHER): Payer: Medicare Other | Admitting: Nurse Practitioner

## 2021-09-15 VITALS — BP 130/75 | HR 71 | Ht 70.0 in | Wt 226.0 lb

## 2021-09-15 DIAGNOSIS — I10 Essential (primary) hypertension: Secondary | ICD-10-CM | POA: Diagnosis not present

## 2021-09-15 DIAGNOSIS — E1165 Type 2 diabetes mellitus with hyperglycemia: Secondary | ICD-10-CM

## 2021-09-15 DIAGNOSIS — E782 Mixed hyperlipidemia: Secondary | ICD-10-CM

## 2021-09-15 DIAGNOSIS — E559 Vitamin D deficiency, unspecified: Secondary | ICD-10-CM

## 2021-09-15 LAB — POCT UA - MICROALBUMIN
Creatinine, POC: 50 mg/dL
Microalbumin Ur, POC: 80 mg/L

## 2021-09-15 MED ORDER — TOUJEO MAX SOLOSTAR 300 UNIT/ML ~~LOC~~ SOPN: 100.0000 [IU] | PEN_INJECTOR | Freq: Every day | SUBCUTANEOUS | 2 refills | Status: DC

## 2021-09-15 MED ORDER — PEN NEEDLES 31G X 8 MM MISC: 2 refills | Status: AC

## 2021-09-15 NOTE — Progress Notes (Signed)
09/15/2021, 1:29 PM       Endocrinology follow-up note    Subjective:    Patient ID: Todd Carey, male    DOB: 1955-11-22.   Todd Carey is being seen in follow-up for management of currently uncontrolled symptomatic type 2 diabetes, hyperlipidemia, hypertension. PMD:  Dairl Ponder, MD.   Past Medical History:  Diagnosis Date   Arthritis    Right shoulder    Diabetes mellitus    Hypercholesteremia    Past Surgical History:  Procedure Laterality Date   COLONOSCOPY     COLONOSCOPY N/A 02/08/2016   Procedure: COLONOSCOPY;  Surgeon: Danie Binder, MD;  Location: [facility];  Service: Endoscopy;  Laterality: N/A;  10:00 Am   Social History   Socioeconomic History   Marital status: Married    Spouse name: Not on file   Number of children: Not on file   Years of education: Not on file   Highest education level: Not on file  Occupational History   Not on file  Tobacco Use   Smoking status: Former    Packs/day: 1.00    Years: 38.00    Total pack years: 38.00    Types: Cigarettes    Quit date: 04/18/2013    Years since quitting: 8.4   Smokeless tobacco: Former  Scientific laboratory technician Use: Never used  Substance and Sexual Activity   Alcohol use: No   Drug use: No   Sexual activity: Not on file  Other Topics Concern   Not on file  Social History Narrative   Not on file   Social Determinants of Health   Financial Resource Strain: Not on file  Food Insecurity: Not on file  Transportation Needs: Not on file  Physical Activity: Not on file  Stress: Not on file  Social Connections: Not on file   Outpatient Encounter Medications as of 09/15/2021  Medication Sig   Insulin Pen Needle (PEN NEEDLES) 31G X 8 MM MISC Use to inject insulin 4 times daily   amLODipine (NORVASC) 5 MG tablet Take 5 mg by mouth daily.   aspirin 81 MG tablet Take 81 mg by mouth daily.   atorvastatin (LIPITOR) 40 MG tablet Take 40 mg by mouth daily.    Cholecalciferol (VITAMIN D) 2000 units tablet Take 2,000 Units by mouth daily.   Continuous Blood Gluc Receiver (FREESTYLE LIBRE 2 READER) DEVI USE TO CHECK GLUCOSE AS DIRECTED   Continuous Blood Gluc Sensor (FREESTYLE LIBRE 2 SENSOR) MISC USE TO CHECK GLUCOSE FOUR TIMES DAILY AS DIRECTED. CHANGE SENSOR EVERY 14 DAYS.   fish oil-omega-3 fatty acids 1000 MG capsule Take 3 g by mouth daily.   HUMALOG KWIKPEN 200 UNIT/ML KwikPen Inject 25-31 Units into the skin with breakfast, with lunch, and with evening meal.   insulin glargine, 2 Unit Dial, (TOUJEO MAX SOLOSTAR) 300 UNIT/ML Solostar Pen Inject 100 Units into the skin at bedtime.   losartan (COZAAR) 100 MG tablet Take 100 mg by mouth daily.   Semaglutide, 2 MG/DOSE, 8 MG/3ML SOPN Inject 2 mg as directed once a week.   [DISCONTINUED] insulin glargine, 2 Unit Dial, (TOUJEO MAX SOLOSTAR) 300 UNIT/ML Solostar Pen Inject 100 Units into the skin at bedtime.   [DISCONTINUED] Insulin Pen Needle 32G X 6 MM MISC SMARTSIG:1 Needle SUB-Q Twice Daily   No facility-administered encounter medications on file as of 09/15/2021.    ALLERGIES: No Known Allergies  VACCINATION STATUS: Immunization History  Administered Date(s) Administered   Influenza,inj,Quad PF,6+ Mos 09/05/2018   Moderna Covid-19 Vaccine Bivalent Booster 14yr & up 11/09/2020   Moderna Sars-Covid-2 Vaccination 04/16/2019, 12/02/2019, 05/25/2020   Zoster Recombinat (Shingrix) 06/20/2018, 08/24/2018    Diabetes He presents for his follow-up diabetic visit. He has type 2 diabetes mellitus. Onset time: He was diagnosed at approximate age of 89years. His disease course has been improving. There are no hypoglycemic associated symptoms. Pertinent negatives for hypoglycemia include no confusion, headaches, pallor or seizures. Associated symptoms include fatigue. Pertinent negatives for diabetes include no blurred vision, no chest pain, no polydipsia, no polyphagia, no polyuria and no weakness. There  are no hypoglycemic complications. Symptoms are stable. There are no diabetic complications. Risk factors for coronary artery disease include diabetes mellitus, dyslipidemia, family history, male sex, hypertension, obesity, sedentary lifestyle and tobacco exposure. Current diabetic treatment includes intensive insulin program and oral agent (monotherapy). He is compliant with treatment most of the time. His weight is fluctuating minimally. He is following a generally healthy diet. Meal planning includes avoidance of concentrated sweets. He has not had a previous visit with a dietitian. He participates in exercise daily (walks at the mall daily). His home blood glucose trend is decreasing steadily. His overall blood glucose range is 140-180 mg/dl. (He presents today with his CGM and logs showing improving glycemic profile overall. His previsit A1c, checked at his PCP office on 09/13/21 was 8.4%, essentially unchanged.  Analysis of his CGM shows TIR 58%, TAR 41%, TBR 1% with a GMI of 7.3%.  He has tolerated the increase in Ozempic well and denies any significant hypoglycemia.) An ACE inhibitor/angiotensin II receptor blocker is being taken. He does not see a podiatrist.Eye exam is current.  Hyperlipidemia This is a chronic problem. The current episode started more than 1 year ago. The problem is controlled. Recent lipid tests were reviewed and are normal. Exacerbating diseases include chronic renal disease, diabetes and obesity. Factors aggravating his hyperlipidemia include fatty foods. Pertinent negatives include no chest pain, myalgias or shortness of breath. Current antihyperlipidemic treatment includes statins. The current treatment provides moderate improvement of lipids. There are no compliance problems.  Risk factors for coronary artery disease include dyslipidemia, diabetes mellitus, family history, male sex, obesity, hypertension and a sedentary lifestyle.  Hypertension This is a chronic problem. The  current episode started more than 1 year ago. The problem has been gradually improving since onset. The problem is controlled. Pertinent negatives include no blurred vision, chest pain, headaches, neck pain, palpitations or shortness of breath. There are no associated agents to hypertension. Risk factors for coronary artery disease include dyslipidemia, diabetes mellitus, obesity, male gender, smoking/tobacco exposure and sedentary lifestyle. Past treatments include angiotensin blockers and calcium channel blockers. The current treatment provides moderate improvement. There are no compliance problems.  Hypertensive end-organ damage includes kidney disease. Identifiable causes of hypertension include chronic renal disease.    Review of systems  Constitutional: + stable body weight,  current Body mass index is 32.43 kg/m. , no fatigue, no subjective hyperthermia, no subjective hypothermia Eyes: no blurry vision, no xerophthalmia ENT: no sore throat, no nodules palpated in throat, no dysphagia/odynophagia, no hoarseness Cardiovascular: no chest pain, no shortness of breath, no palpitations, no leg swelling Respiratory: no cough, no shortness of breath Gastrointestinal: no nausea/vomiting/diarrhea, + increased gas Musculoskeletal: no muscle/joint aches Skin: no rashes, no hyperemia Neurological: no tremors, no numbness, no tingling, no dizziness Psychiatric: no depression, no anxiety   Objective:    BP 130/75  Pulse 71   Ht '5\' 10"'  (1.778 m)   Wt 226 lb (102.5 kg)   BMI 32.43 kg/m   Wt Readings from Last 3 Encounters:  09/15/21 226 lb (102.5 kg)  06/09/21 228 lb (103.4 kg)  02/03/21 227 lb 12.8 oz (103.3 kg)    BP Readings from Last 3 Encounters:  09/15/21 130/75  06/09/21 135/71  06/06/21 (!) 159/74     Physical Exam- Limited  Constitutional:  Body mass index is 32.43 kg/m. , not in acute distress, normal state of mind Eyes:  EOMI, no exophthalmos Neck: Supple Cardiovascular:  RRR, no murmurs, rubs, or gallops, no edema Respiratory: Adequate breathing efforts, no crackles, rales, rhonchi, or wheezing Musculoskeletal: no gross deformities, strength intact in all four extremities, no gross restriction of joint movements Skin:  no rashes, no hyperemia Neurological: no tremor with outstretched hands    Recent Results (from the past 2160 hour(s))  Hemoglobin A1c     Status: None   Collection Time: 09/13/21 12:00 AM  Result Value Ref Range   Hemoglobin A1C 8.5   CBC and differential     Status: Abnormal   Collection Time: 09/13/21 12:00 AM  Result Value Ref Range   Hemoglobin 12.9 (A) 13.5 - 17.5   HCT 39 (A) 41 - 53  Basic metabolic panel     Status: None   Collection Time: 09/13/21 12:00 AM  Result Value Ref Range   Glucose 170    BUN 18 4 - 21   Creatinine 1.2 0.6 - 1.3  Comprehensive metabolic panel     Status: None   Collection Time: 09/13/21 12:00 AM  Result Value Ref Range   eGFR 68   Lipid panel     Status: None   Collection Time: 09/13/21 12:00 AM  Result Value Ref Range   Triglycerides 102 40 - 160   LDL Cholesterol 26   Hemoglobin A1c     Status: None   Collection Time: 09/13/21 12:00 AM  Result Value Ref Range   Hemoglobin A1C 8.4   TSH     Status: None   Collection Time: 09/13/21 12:00 AM  Result Value Ref Range   TSH 0.73 0.41 - 5.90  POCT UA - Microalbumin     Status: Abnormal   Collection Time: 09/15/21  1:20 PM  Result Value Ref Range   Microalbumin Ur, POC 80 mg/L   Creatinine, POC 50 mg/dL   Albumin/Creatinine Ratio, Urine, POC 30-300      Recent Results (from the past 2160 hour(s))  Hemoglobin A1c     Status: None   Collection Time: 09/13/21 12:00 AM  Result Value Ref Range   Hemoglobin A1C 8.5   CBC and differential     Status: Abnormal   Collection Time: 09/13/21 12:00 AM  Result Value Ref Range   Hemoglobin 12.9 (A) 13.5 - 17.5   HCT 39 (A) 41 - 53  Basic metabolic panel     Status: None   Collection Time:  09/13/21 12:00 AM  Result Value Ref Range   Glucose 170    BUN 18 4 - 21   Creatinine 1.2 0.6 - 1.3  Comprehensive metabolic panel     Status: None   Collection Time: 09/13/21 12:00 AM  Result Value Ref Range   eGFR 68   Lipid panel     Status: None   Collection Time: 09/13/21 12:00 AM  Result Value Ref Range   Triglycerides 102 40 - 160   LDL  Cholesterol 26   Hemoglobin A1c     Status: None   Collection Time: 09/13/21 12:00 AM  Result Value Ref Range   Hemoglobin A1C 8.4   TSH     Status: None   Collection Time: 09/13/21 12:00 AM  Result Value Ref Range   TSH 0.73 0.41 - 5.90  POCT UA - Microalbumin     Status: Abnormal   Collection Time: 09/15/21  1:20 PM  Result Value Ref Range   Microalbumin Ur, POC 80 mg/L   Creatinine, POC 50 mg/dL   Albumin/Creatinine Ratio, Urine, POC 30-300       Assessment & Plan:   1) Uncontrolled type 2 diabetes mellitus with hyperglycemia ([place])  - Todd Carey has currently uncontrolled symptomatic type 2 DM since 66 years of age.  He presents today with his CGM and logs showing improving glycemic profile overall. His previsit A1c, checked at his PCP office on 09/13/21 was 8.4%, essentially unchanged.  Analysis of his CGM shows TIR 58%, TAR 41%, TBR 1% with a GMI of 7.3%.  He has tolerated the increase in Ozempic well and denies any significant hypoglycemia.  - Recent labs reviewed.  -his diabetes is complicated by obesity/sedentary life, and Todd Carey remains at a high risk for more acute and chronic complications which include CAD, CVA, CKD, retinopathy, and neuropathy. These are all discussed in detail with the patient.  - Nutritional counseling repeated at each appointment due to patients tendency to fall back in to old habits.  - The patient admits there is a room for improvement in their diet and drink choices. -  Suggestion is made for the patient to avoid simple carbohydrates from their diet including Cakes, Sweet Desserts /  Pastries, Ice Cream, Soda (diet and regular), Sweet Tea, Candies, Chips, Cookies, Sweet Pastries, Store Bought Juices, Alcohol in Excess of 1-2 drinks a day, Artificial Sweeteners, Coffee Creamer, and "Sugar-free" Products. This will help patient to have stable blood glucose profile and potentially avoid unintended weight gain.   - I encouraged the patient to switch to unprocessed or minimally processed complex starch and increased protein intake (animal or plant source), fruits, and vegetables.   - Patient is advised to stick to a routine mealtimes to eat 3 meals a day and avoid unnecessary snacks (to snack only to correct hypoglycemia).  -He follows with a dietitian in [place].  - I have approached him with the following individualized plan to manage diabetes and patient agrees:   -He will continue to require intensive treatment with basal/bolus insulin in order for him to maintain control of diabetes to target.  I did send in for longer pen needles today.  -He is advised to continue his Toujeo 100 units SQ daily and Humalog 25-31 units TID with meals if glucose is above 90 and he is eating (Specific instructions on how to titrate insulin dosage based on glucose readings given to patient in writing).  He is also advised to continue his Ozempic to 2 mg SQ weekly.    -He is encouraged to continue using CGM to monitor blood glucose at least 4 times per day, before meals and at bedtime, and call the clinic for readings less than 70 or greater than 200 for 3 tests in a row.  - Patient specific target  A1c;  LDL, HDL, Triglycerides, and  Waist Circumference were discussed in detail.  2) BP/HTN:  His blood pressure is controlled to target.  He is advised to continue Losartan  100 mg po daily and Amlodipine 5 mg po daily.  3) Lipids/HPL:  His most recent lipid panel from 10/05/20 shows controlled LDL at 87 and Triglycerides of 190.  He is advised to continue Atorvastatin 40 mg po daily at bedtime, and  Fish Oil 3g po daily.   4)  Weight/Diet:  His Body mass index is 32.43 kg/m.-clearly complicating the care of his diabetes.  He is a candidate for modest weight loss.  CDE Consult will be initiated , exercise, and detailed carbohydrates information provided.  5) Chronic Care/Health Maintenance: -he is on ACEI/ARB and Statin medications and is encouraged to continue to follow up with Ophthalmology, Dentist,  Podiatrist at least yearly or according to recommendations, and advised to  stay away from smoking. I have recommended yearly flu vaccine and pneumonia vaccination at least every 5 years; moderate intensity exercise for up to 150 minutes weekly; and  sleep for at least 7 hours a day.  - I advised patient to maintain close follow up with Dairl Ponder, MD for primary care needs.     I spent 34 minutes in the care of the patient today including review of labs from [facility], Lipids, Thyroid Function, Hematology (current and previous including abstractions from other facilities); face-to-face time discussing  his blood glucose readings/logs, discussing hypoglycemia and hyperglycemia episodes and symptoms, medications doses, his options of short and long term treatment based on the latest standards of care / guidelines;  discussion about incorporating lifestyle medicine;  and documenting the encounter. Risk reduction counseling performed per USPSTF guidelines to reduce obesity and cardiovascular risk factors.     Please refer to Patient Instructions for Blood Glucose Monitoring and Insulin/Medications Dosing Guide"  in media tab for additional information. Please  also refer to " Patient Self Inventory" in the Media  tab for reviewed elements of pertinent patient history.  Todd Carey participated in the discussions, expressed understanding, and voiced agreement with the above plans.  All questions were answered to his satisfaction. he is encouraged to contact clinic should he have any questions  or concerns prior to his return visit.    Follow up plan: - Return in about 3 months (around 12/16/2021) for Diabetes F/U with A1c in office, No previsit labs, Bring meter and logs.  Rayetta Pigg, [facility] [address] [place], [place] [address] Phone: [phone] Fax: [phone]  09/15/2021, 1:29 PM

## 2021-09-22 ENCOUNTER — Telehealth: Payer: Self-pay | Admitting: Nurse Practitioner

## 2021-09-22 NOTE — Telephone Encounter (Signed)
Pt picked up meds.

## 2021-09-22 NOTE — Telephone Encounter (Signed)
Left vm that his pt assistance is here ready for p/u. In 2nd fridge

## 2021-09-27 ENCOUNTER — Encounter (HOSPITAL_COMMUNITY): Payer: Self-pay

## 2021-09-27 ENCOUNTER — Ambulatory Visit (HOSPITAL_BASED_OUTPATIENT_CLINIC_OR_DEPARTMENT_OTHER): Payer: Medicare Other | Admitting: Anesthesiology

## 2021-09-27 ENCOUNTER — Other Ambulatory Visit: Payer: Self-pay

## 2021-09-27 ENCOUNTER — Ambulatory Visit (HOSPITAL_COMMUNITY)
Admission: RE | Admit: 2021-09-27 | Discharge: 2021-09-27 | Disposition: A | Discharge status: Home / Self Care | Payer: Medicare Other | Source: Ambulatory Visit | Attending: Internal Medicine | Admitting: Internal Medicine

## 2021-09-27 ENCOUNTER — Encounter (HOSPITAL_COMMUNITY)
Admission: RE | Disposition: A | Discharge status: Home / Self Care | Payer: Self-pay | Source: Ambulatory Visit | Attending: Internal Medicine

## 2021-09-27 ENCOUNTER — Ambulatory Visit (HOSPITAL_COMMUNITY): Payer: Medicare Other | Admitting: Anesthesiology

## 2021-09-27 DIAGNOSIS — E119 Type 2 diabetes mellitus without complications: Secondary | ICD-10-CM | POA: Diagnosis not present

## 2021-09-27 DIAGNOSIS — Z86010 Personal history of colonic polyps: Secondary | ICD-10-CM | POA: Diagnosis not present

## 2021-09-27 DIAGNOSIS — K635 Polyp of colon: Secondary | ICD-10-CM | POA: Diagnosis not present

## 2021-09-27 DIAGNOSIS — K5730 Diverticulosis of large intestine without perforation or abscess without bleeding: Secondary | ICD-10-CM | POA: Insufficient documentation

## 2021-09-27 DIAGNOSIS — K648 Other hemorrhoids: Secondary | ICD-10-CM | POA: Diagnosis not present

## 2021-09-27 DIAGNOSIS — I10 Essential (primary) hypertension: Secondary | ICD-10-CM | POA: Insufficient documentation

## 2021-09-27 DIAGNOSIS — Z1211 Encounter for screening for malignant neoplasm of colon: Secondary | ICD-10-CM | POA: Diagnosis present

## 2021-09-27 DIAGNOSIS — D123 Benign neoplasm of transverse colon: Secondary | ICD-10-CM

## 2021-09-27 DIAGNOSIS — K5790 Diverticulosis of intestine, part unspecified, without perforation or abscess without bleeding: Secondary | ICD-10-CM

## 2021-09-27 DIAGNOSIS — D125 Benign neoplasm of sigmoid colon: Secondary | ICD-10-CM

## 2021-09-27 DIAGNOSIS — Z87891 Personal history of nicotine dependence: Secondary | ICD-10-CM | POA: Insufficient documentation

## 2021-09-27 HISTORY — PX: COLONOSCOPY WITH PROPOFOL: SHX5780

## 2021-09-27 HISTORY — PX: POLYPECTOMY: SHX5525

## 2021-09-27 LAB — GLUCOSE, CAPILLARY: Glucose-Capillary: 155 mg/dL — ABNORMAL HIGH (ref 70–99)

## 2021-09-27 SURGERY — COLONOSCOPY WITH PROPOFOL
Anesthesia: General

## 2021-09-27 MED ORDER — LIDOCAINE HCL 1 % IJ SOLN
INTRAMUSCULAR | Status: DC | PRN
  Administered 2021-09-27: 50 mg via INTRADERMAL

## 2021-09-27 MED ORDER — PROPOFOL 10 MG/ML IV BOLUS
INTRAVENOUS | Status: DC | PRN
  Administered 2021-09-27: 80 mg via INTRAVENOUS
  Administered 2021-09-27: 10 mg via INTRAVENOUS
  Administered 2021-09-27 (×2): 20 mg via INTRAVENOUS

## 2021-09-27 MED ORDER — LACTATED RINGERS IV SOLN: INTRAVENOUS | Status: DC

## 2021-09-27 MED ORDER — PROPOFOL 500 MG/50ML IV EMUL
INTRAVENOUS | Status: DC | PRN
  Administered 2021-09-27: 150 ug/kg/min via INTRAVENOUS

## 2021-09-27 NOTE — Anesthesia Preprocedure Evaluation (Signed)
Anesthesia Evaluation  Patient identified by MRN, date of birth, ID band Patient awake    Reviewed: Allergy & Precautions, H&P , NPO status , Patient's Chart, lab work & pertinent test results, reviewed documented beta blocker date and time   Airway Mallampati: II  TM Distance: >3 FB Neck ROM: full    Dental no notable dental hx.    Pulmonary neg pulmonary ROS, former smoker,    Pulmonary exam normal breath sounds clear to auscultation       Cardiovascular Exercise Tolerance: Good hypertension,  Rhythm:regular Rate:Normal     Neuro/Psych negative neurological ROS  negative psych ROS   GI/Hepatic negative GI ROS, Neg liver ROS,   Endo/Other  diabetes, Type 2Morbid obesity  Renal/GU negative Renal ROS  negative genitourinary   Musculoskeletal   Abdominal   Peds  Hematology negative hematology ROS (+)   Anesthesia Other Findings   Reproductive/Obstetrics negative OB ROS                             Anesthesia Physical Anesthesia Plan  ASA: 2  Anesthesia Plan: General   Post-op Pain Management:    Induction:   PONV Risk Score and Plan: Propofol infusion  Airway Management Planned:   Additional Equipment:   Intra-op Plan:   Post-operative Plan:   Informed Consent: I have reviewed the patients History and Physical, chart, labs and discussed the procedure including the risks, benefits and alternatives for the proposed anesthesia with the patient or authorized representative who has indicated his/her understanding and acceptance.     Dental Advisory Given  Plan Discussed with: CRNA  Anesthesia Plan Comments:         Anesthesia Quick Evaluation

## 2021-09-27 NOTE — H&P (Signed)
Primary Care Physician:  Dairl Ponder, MD Primary Gastroenterologist:  Dr. Abbey Chatters  Pre-Procedure History & Physical: HPI:  Todd Carey is a 66 y.o. male is here for a colonoscopy to be performed for surveillance purposes, personal history of adenomatous colon polyp.   Past Medical History:  Diagnosis Date   Arthritis    Right shoulder    Diabetes mellitus    Hypercholesteremia     Past Surgical History:  Procedure Laterality Date   COLONOSCOPY     COLONOSCOPY N/A 02/08/2016   Procedure: COLONOSCOPY;  Surgeon: Danie Binder, MD;  [facility];  Service: Endoscopy;  Laterality: N/A;  10:00 Am    Prior to Admission medications   Medication Sig Start Date End Date Taking? Authorizing Provider  amLODipine (NORVASC) 5 MG tablet Take 5 mg by mouth daily. 10/20/19  Yes [provider]  aspirin 81 MG tablet Take 81 mg by mouth daily.   Yes [provider]  atorvastatin (LIPITOR) 40 MG tablet Take 40 mg by mouth daily.   Yes [provider]  Cholecalciferol (VITAMIN D) 2000 units tablet Take 2,000 Units by mouth daily.   Yes [provider]  fish oil-omega-3 fatty acids 1000 MG capsule Take 3 g by mouth daily.   Yes [provider]  insulin glargine, 2 Unit Dial, (TOUJEO MAX SOLOSTAR) 300 UNIT/ML Solostar Pen Inject 100 Units into the skin at bedtime. 09/15/21  Yes Reardon, Juanetta Beets, NP  insulin lispro (HUMALOG KWIKPEN) 200 UNIT/ML KwikPen Inject 25-31 Units into the skin 3 (three) times daily before meals.   Yes [provider]  losartan (COZAAR) 100 MG tablet Take 100 mg by mouth daily.   Yes [provider]  metFORMIN (GLUCOPHAGE) 1000 MG tablet Take 1,000 mg by mouth 2 (two) times daily with a meal. 09/14/21  Yes [provider]  Semaglutide, 2 MG/DOSE, (OZEMPIC, 2 MG/DOSE,) 8 MG/3ML SOPN Inject 2 mg into the skin every Saturday.   Yes [provider]  Continuous Blood Gluc Receiver (FREESTYLE  LIBRE 2 READER) Drake USE TO CHECK GLUCOSE AS DIRECTED 01/04/21   Brita Romp, NP  Continuous Blood Gluc Sensor (FREESTYLE LIBRE 2 SENSOR) MISC USE TO CHECK GLUCOSE FOUR TIMES DAILY AS DIRECTED. CHANGE SENSOR EVERY 14 DAYS. 07/19/21   Brita Romp, NP  Insulin Pen Needle (PEN NEEDLES) 31G X 8 MM MISC Use to inject insulin 4 times daily 09/15/21   Brita Romp, NP    Allergies as of 08/24/2021   (No Known Allergies)    Family History  Problem Relation Age of Onset   Hyperlipidemia Mother    CAD Father    Colon cancer Neg Hx     Social History   Socioeconomic History   Marital status: Married    Spouse name: Not on file   Number of children: Not on file   Years of education: Not on file   Highest education level: Not on file  Occupational History   Not on file  Tobacco Use   Smoking status: Former    Packs/day: 1.00    Years: 38.00    Total pack years: 38.00    Types: Cigarettes    Quit date: 04/18/2013    Years since quitting: 8.4   Smokeless tobacco: Former  Scientific laboratory technician Use: Never used  Substance and Sexual Activity   Alcohol use: No   Drug use: No   Sexual activity: Not on file  Other Topics  Concern   Not on file  Social History Narrative   Not on file   Social Determinants of Health   Financial Resource Strain: Not on file  Food Insecurity: Not on file  Transportation Needs: Not on file  Physical Activity: Not on file  Stress: Not on file  Social Connections: Not on file  Intimate Partner Violence: Not on file    Review of Systems: See HPI, otherwise negative ROS  Physical Exam: Vital signs in last 24 hours:     General:   Alert,  Well-developed, well-nourished, pleasant and cooperative in NAD Head:  Normocephalic and atraumatic. Eyes:  Sclera clear, no icterus.   Conjunctiva pink. Ears:  Normal auditory acuity. Nose:  No deformity, discharge,  or lesions. Mouth:  No deformity or lesions, dentition normal. Neck:  Supple; no  masses or thyromegaly. Lungs:  Clear throughout to auscultation.   No wheezes, crackles, or rhonchi. No acute distress. Heart:  Regular rate and rhythm; no murmurs, clicks, rubs,  or gallops. Abdomen:  Soft, nontender and nondistended. No masses, hepatosplenomegaly or hernias noted. Normal bowel sounds, without guarding, and without rebound.   Msk:  Symmetrical without gross deformities. Normal posture. Extremities:  Without clubbing or edema. Neurologic:  Alert and  oriented x4;  grossly normal neurologically. Skin:  Intact without significant lesions or rashes. Cervical Nodes:  No significant cervical adenopathy. Psych:  Alert and cooperative. Normal mood and affect.  Impression/Plan: Todd Carey is here for a colonoscopy to be performed for surveillance purposes, personal history of adenomatous colon polyp.   The risks of the procedure including infection, bleed, or perforation as well as benefits, limitations, alternatives and imponderables have been reviewed with the patient. Questions have been answered. All parties agreeable.

## 2021-09-27 NOTE — Discharge Instructions (Addendum)
  Colonoscopy Discharge Instructions  Read the instructions outlined below and refer to this sheet in the next few weeks. These discharge instructions provide you with general information on caring for yourself after you leave the hospital. Your doctor may also give you specific instructions. While your treatment has been planned according to the most current medical practices available, unavoidable complications occasionally occur.   ACTIVITY You may resume your regular activity, but move at a slower pace for the next 24 hours.  Take frequent rest periods for the next 24 hours.  Walking will help get rid of the air and reduce the bloated feeling in your belly (abdomen).  No driving for 24 hours (because of the medicine (anesthesia) used during the test).   Do not sign any important legal documents or operate any machinery for 24 hours (because of the anesthesia used during the test).  NUTRITION Drink plenty of fluids.  You may resume your normal diet as instructed by your doctor.  Begin with a light meal and progress to your normal diet. Heavy or fried foods are harder to digest and may make you feel sick to your stomach (nauseated).  Avoid alcoholic beverages for 24 hours or as instructed.  MEDICATIONS You may resume your normal medications unless your doctor tells you otherwise.  WHAT YOU CAN EXPECT TODAY Some feelings of bloating in the abdomen.  Passage of more gas than usual.  Spotting of blood in your stool or on the toilet paper.  IF YOU HAD POLYPS REMOVED DURING THE COLONOSCOPY: No aspirin products for 7 days or as instructed.  No alcohol for 7 days or as instructed.  Eat a soft diet for the next 24 hours.  FINDING OUT THE RESULTS OF YOUR TEST Not all test results are available during your visit. If your test results are not back during the visit, make an appointment with your caregiver to find out the results. Do not assume everything is normal if you have not heard from your  caregiver or the medical facility. It is important for you to follow up on all of your test results.  SEEK IMMEDIATE MEDICAL ATTENTION IF: You have more than a spotting of blood in your stool.  Your belly is swollen (abdominal distention).  You are nauseated or vomiting.  You have a temperature over 101.  You have abdominal pain or discomfort that is severe or gets worse throughout the day.   Your colonoscopy revealed 3 polyp(s) which I removed successfully. Await pathology results, my office will contact you. I recommend repeating colonoscopy in 5-10 years for surveillance purposes depending on pathology.   You also have diverticulosis and internal hemorrhoids. I would recommend increasing fiber in your diet or adding OTC Benefiber/Metamucil. Be sure to drink at least 4 to 6 glasses of water daily. Follow-up with GI as needed.     I hope you have a great rest of your week!  Elon Alas. Abbey Chatters, D.O. Gastroenterology and Hepatology [facility]

## 2021-09-27 NOTE — Transfer of Care (Addendum)
Immediate Anesthesia Transfer of Care Note  Patient: Todd Carey  Procedure(s) Performed: COLONOSCOPY WITH PROPOFOL POLYPECTOMY  Patient Location: Endoscopy Unit  Anesthesia Type:General  Level of Consciousness: awake  Airway & Oxygen Therapy: Patient Spontanous Breathing  Post-op Assessment: Report given to RN  Post vital signs: Reviewed and stable  Last Vitals:  Vitals Value Taken Time  BP 154/81   Temp 36.5   Pulse 70   Resp 16   SpO2 99     Last Pain:  Vitals:   09/27/21 1111  TempSrc: Oral  PainSc: 0-No pain         Complications: No notable events documented.

## 2021-09-27 NOTE — Op Note (Signed)
[facility] Patient Name: Todd Carey Procedure Date: 09/27/2021 11:12 AM MRN: [id number] Date of Birth: [birth year] Attending MD: Elon Alas. Edgar Frisk CSN: [id number] Age: 66 Admit Type: Outpatient Procedure:                Colonoscopy Indications:              Screening for colorectal malignant neoplasm Providers:                Elon Alas. Abbey Chatters, DO, Caprice Kluver, [place]                            Risa Grill, Technician, Bethel Born,                            [employer] Referring MD:              Medicines:                See the Anesthesia note for documentation of the                            administered medications Complications:            No immediate complications. Estimated Blood Loss:     Estimated blood loss was minimal. Procedure:                Pre-Anesthesia Assessment:                           - The anesthesia plan was to use monitored                            anesthesia care (MAC).                           After obtaining informed consent, the colonoscope                            was passed under direct vision. Throughout the                            procedure, the patient's blood pressure, pulse, and                            oxygen saturations were monitored continuously. The                            [id number]-HQ190L ([id number]) scope was introduced through                            the anus and advanced to the the cecum, identified                            by appendiceal orifice and ileocecal valve. The                            colonoscopy was performed without difficulty. The  patient tolerated the procedure well. The quality                            of the bowel preparation was evaluated using the                            BBPS [facility] Bowel Preparation Scale) with scores                            of: Right Colon = 3, Transverse Colon = 3 and Left                            Colon = 3 (entire mucosa  seen well with no residual                            staining, small fragments of stool or opaque                            liquid). The total BBPS score equals 9. Scope In: 11:33:14 AM Scope Out: 11:47:49 AM Scope Withdrawal Time: 0 hours 11 minutes 59 seconds  Total Procedure Duration: 0 hours 14 minutes 35 seconds  Findings:      The perianal and digital rectal examinations were normal.      Non-bleeding internal hemorrhoids were found during endoscopy.      Multiple small-mouthed diverticula were found in the sigmoid colon.      Two sessile polyps were found in the sigmoid colon. The polyps were 4 to       5 mm in size. These polyps were removed with a cold snare. Resection and       retrieval were complete.      A 2 mm polyp was found in the transverse colon. The polyp was sessile.       The polyp was removed with a cold biopsy forceps. Resection and       retrieval were complete.      The exam was otherwise without abnormality. Impression:               - Non-bleeding internal hemorrhoids.                           - Diverticulosis in the sigmoid colon.                           - Two 4 to 5 mm polyps in the sigmoid colon,                            removed with a cold snare. Resected and retrieved.                           - One 2 mm polyp in the transverse colon, removed                            with a cold biopsy forceps. Resected and retrieved.                           -  The examination was otherwise normal. Moderate Sedation:      Per Anesthesia Care Recommendation:           - Patient has a contact number available for                            emergencies. The signs and symptoms of potential                            delayed complications were discussed with the                            patient. Return to normal activities tomorrow.                            Written discharge instructions were provided to the                            patient.                            - Resume previous diet.                           - Continue present medications.                           - Await pathology results.                           - Repeat colonoscopy in 5-10 years for surveillance.                           - Return to GI clinic PRN. Procedure Code(s):        --- Professional ---                           [phone], Colonoscopy, flexible; with removal of                            tumor(s), polyp(s), or other lesion(s) by snare                            technique                           45380, 7, Colonoscopy, flexible; with biopsy,                            single or multiple Diagnosis Code(s):        --- Professional ---                           K63.5, Polyp of colon                           Z12.11, Encounter for screening for malignant  neoplasm of colon                           K64.8, Other hemorrhoids                           K57.30, Diverticulosis of large intestine without                            perforation or abscess without bleeding CPT copyright 2019 [employer] Association. All rights reserved. The codes documented in this report are preliminary and upon coder review may  be revised to meet current compliance requirements. Elon Alas. Abbey Chatters, DO [place] Abbey Chatters, DO 09/27/2021 11:51:54 AM This report has been signed electronically. Number of Addenda: 0

## 2021-09-29 NOTE — Anesthesia Postprocedure Evaluation (Signed)
Anesthesia Post Note  Patient: Todd Carey  Procedure(s) Performed: COLONOSCOPY WITH PROPOFOL POLYPECTOMY  Patient location during evaluation: Phase II Anesthesia Type: General Level of consciousness: awake Pain management: pain level controlled Vital Signs Assessment: post-procedure vital signs reviewed and stable Respiratory status: spontaneous breathing and respiratory function stable Cardiovascular status: blood pressure returned to baseline and stable Postop Assessment: no headache and no apparent nausea or vomiting Anesthetic complications: no Comments: Late entry   No notable events documented.   Last Vitals:  Vitals:   09/27/21 1111 09/27/21 1155  BP: (!) 154/81 (!) 106/52  Pulse: 70 71  Resp: 16 17  Temp: 36.6 C 36.6 C  SpO2: 99% 97%    Last Pain:  Vitals:   09/27/21 1155  TempSrc: Oral  PainSc: 0-No pain                 Louann Sjogren

## 2021-09-30 LAB — SURGICAL PATHOLOGY

## 2021-10-04 ENCOUNTER — Encounter (HOSPITAL_COMMUNITY): Payer: Self-pay | Admitting: Internal Medicine

## 2021-10-18 ENCOUNTER — Other Ambulatory Visit: Payer: Self-pay | Admitting: Nurse Practitioner

## 2021-10-18 DIAGNOSIS — E1165 Type 2 diabetes mellitus with hyperglycemia: Secondary | ICD-10-CM

## 2021-12-19 LAB — COMPREHENSIVE METABOLIC PANEL: eGFR: 77

## 2021-12-19 LAB — BASIC METABOLIC PANEL
BUN: 12 (ref 4–21)
Creatinine: 1.1 (ref 0.6–1.3)
Glucose: 133

## 2021-12-19 LAB — HEMOGLOBIN A1C: Hemoglobin A1C: 7.4

## 2021-12-21 ENCOUNTER — Encounter: Payer: Self-pay | Admitting: Nurse Practitioner

## 2021-12-21 ENCOUNTER — Other Ambulatory Visit: Payer: Self-pay | Admitting: Nurse Practitioner

## 2021-12-21 ENCOUNTER — Ambulatory Visit (INDEPENDENT_AMBULATORY_CARE_PROVIDER_SITE_OTHER): Payer: Medicare Other | Admitting: Nurse Practitioner

## 2021-12-21 VITALS — BP 125/67 | HR 76 | Ht 70.0 in | Wt 223.6 lb

## 2021-12-21 DIAGNOSIS — E559 Vitamin D deficiency, unspecified: Secondary | ICD-10-CM

## 2021-12-21 DIAGNOSIS — E782 Mixed hyperlipidemia: Secondary | ICD-10-CM | POA: Diagnosis not present

## 2021-12-21 DIAGNOSIS — E1165 Type 2 diabetes mellitus with hyperglycemia: Secondary | ICD-10-CM

## 2021-12-21 DIAGNOSIS — I10 Essential (primary) hypertension: Secondary | ICD-10-CM | POA: Diagnosis not present

## 2021-12-21 MED ORDER — TOUJEO MAX SOLOSTAR 300 UNIT/ML ~~LOC~~ SOPN
80.0000 [IU] | PEN_INJECTOR | Freq: Every day | SUBCUTANEOUS | 2 refills | Status: DC
Start: 2021-12-21 — End: 2023-07-25

## 2021-12-21 MED ORDER — HUMALOG KWIKPEN 200 UNIT/ML ~~LOC~~ SOPN: 25.0000 [IU] | PEN_INJECTOR | Freq: Three times a day (TID) | SUBCUTANEOUS | 3 refills | Status: DC

## 2021-12-21 MED ORDER — INSULIN LISPRO (1 UNIT DIAL) 100 UNIT/ML (KWIKPEN): 25.0000 [IU] | PEN_INJECTOR | Freq: Three times a day (TID) | SUBCUTANEOUS | 0 refills | Status: DC

## 2021-12-21 MED ORDER — METFORMIN HCL 1000 MG PO TABS: 1000.0000 mg | ORAL_TABLET | Freq: Two times a day (BID) | ORAL | 3 refills | Status: DC

## 2021-12-21 MED ORDER — OZEMPIC (2 MG/DOSE) 8 MG/3ML ~~LOC~~ SOPN
2.0000 mg | PEN_INJECTOR | SUBCUTANEOUS | 3 refills | Status: DC
Start: 2021-12-24 — End: 2023-11-07

## 2021-12-21 NOTE — Telephone Encounter (Signed)
Do we have any idea what is covered?  I'm fine with switching to whatever is covered by insurance.

## 2021-12-21 NOTE — Telephone Encounter (Signed)
Humalog Kwikpen 100unit/mL sent in to replace the Humalog kwikpen 200 unit/mL.

## 2021-12-21 NOTE — Progress Notes (Signed)
12/21/2021, 1:22 PM       Endocrinology follow-up note    Subjective:    Patient ID: Todd Carey, male    DOB: 12/11/1955.   Todd Carey is being seen in follow-up for management of currently uncontrolled symptomatic type 2 diabetes, hyperlipidemia, hypertension. PMD:  Dairl Ponder, MD.   Past Medical History:  Diagnosis Date   Arthritis    Right shoulder    Diabetes mellitus    Hypercholesteremia    Past Surgical History:  Procedure Laterality Date   COLONOSCOPY     COLONOSCOPY N/A 02/08/2016   Procedure: COLONOSCOPY;  Surgeon: Danie Binder, MD;  [facility];  Service: Endoscopy;  Laterality: N/A;  10:00 Am   COLONOSCOPY WITH PROPOFOL N/A 09/27/2021   Procedure: COLONOSCOPY WITH PROPOFOL;  Surgeon: Eloise Harman, DO;  [facility];  Service: Endoscopy;  Laterality: N/A;  12:15pm.   POLYPECTOMY  09/27/2021   Procedure: POLYPECTOMY;  Surgeon: Eloise Harman, DO;  [facility];  Service: Endoscopy;;   Social History   Socioeconomic History   Marital status: Married    Spouse name: Not on file   Number of children: Not on file   Years of education: Not on file   Highest education level: Not on file  Occupational History   Not on file  Tobacco Use   Smoking status: Former    Packs/day: 1.00    Years: 38.00    Total pack years: 38.00    Types: Cigarettes    Quit date: 04/18/2013    Years since quitting: 8.6   Smokeless tobacco: Former  Scientific laboratory technician Use: Never used  Substance and Sexual Activity   Alcohol use: No   Drug use: No   Sexual activity: Not on file  Other Topics Concern   Not on file  Social History Narrative   Not on file   Social Determinants of Health   Financial Resource Strain: Not on file  Food Insecurity: Not on file  Transportation Needs: Not on file  Physical Activity: Not on file  Stress: Not on file  Social Connections: Not on file   Outpatient  Encounter Medications as of 12/21/2021  Medication Sig   amLODipine (NORVASC) 5 MG tablet Take 5 mg by mouth daily.   aspirin 81 MG tablet Take 81 mg by mouth daily.   atorvastatin (LIPITOR) 40 MG tablet Take 40 mg by mouth daily.   Cholecalciferol (VITAMIN D) 2000 units tablet Take 2,000 Units by mouth daily.   Continuous Blood Gluc Receiver (FREESTYLE LIBRE 2 READER) DEVI USE TO CHECK GLUCOSE AS DIRECTED   Continuous Blood Gluc Sensor (FREESTYLE LIBRE 2 SENSOR) MISC USE TO CHECK GLUCOSE FOUR TIMES DAILY AS DIRECTED. CHANGE SENSOR EVERY 14 DAYS.   fish oil-omega-3 fatty acids 1000 MG capsule Take 3 g by mouth daily.   Insulin Pen Needle (PEN NEEDLES) 31G X 8 MM MISC Use to inject insulin 4 times daily   losartan (COZAAR) 100 MG tablet Take 100 mg by mouth daily.   [DISCONTINUED] insulin glargine, 2 Unit Dial, (TOUJEO MAX SOLOSTAR) 300 UNIT/ML Solostar Pen Inject 100 Units into the skin at bedtime.   [DISCONTINUED] insulin lispro (HUMALOG KWIKPEN) 200 UNIT/ML KwikPen Inject 25-31 Units into the skin 3 (three) times daily before meals.   [DISCONTINUED] metFORMIN (GLUCOPHAGE) 1000 MG tablet Take 1,000 mg by mouth 2 (two) times daily with a meal.   [  DISCONTINUED] Semaglutide, 2 MG/DOSE, (OZEMPIC, 2 MG/DOSE,) 8 MG/3ML SOPN Inject 2 mg into the skin every Saturday.   insulin glargine, 2 Unit Dial, (TOUJEO MAX SOLOSTAR) 300 UNIT/ML Solostar Pen Inject 80 Units into the skin at bedtime.   insulin lispro (HUMALOG KWIKPEN) 200 UNIT/ML KwikPen Inject 25-31 Units into the skin 3 (three) times daily before meals.   metFORMIN (GLUCOPHAGE) 1000 MG tablet Take 1 tablet (1,000 mg total) by mouth 2 (two) times daily with a meal.   [START ON 12/24/2021] Semaglutide, 2 MG/DOSE, (OZEMPIC, 2 MG/DOSE,) 8 MG/3ML SOPN Inject 2 mg into the skin every Saturday.   No facility-administered encounter medications on file as of 12/21/2021.    ALLERGIES: No Known Allergies  VACCINATION STATUS: Immunization History   Administered Date(s) Administered   Influenza,inj,Quad PF,6+ Mos 09/05/2018   Moderna Covid-19 Vaccine Bivalent Booster 59yr & up 11/09/2020   Moderna Sars-Covid-2 Vaccination 04/16/2019, 12/02/2019, 05/25/2020   Zoster Recombinat (Shingrix) 06/20/2018, 08/24/2018    Diabetes He presents for his follow-up diabetic visit. He has type 2 diabetes mellitus. Onset time: He was diagnosed at approximate age of 89years. His disease course has been improving. There are no hypoglycemic associated symptoms. Pertinent negatives for hypoglycemia include no confusion, headaches, pallor or seizures. Associated symptoms include fatigue. Pertinent negatives for diabetes include no blurred vision, no chest pain, no polydipsia, no polyphagia, no polyuria and no weakness. There are no hypoglycemic complications. Symptoms are stable. There are no diabetic complications. Risk factors for coronary artery disease include diabetes mellitus, dyslipidemia, family history, male sex, hypertension, obesity, sedentary lifestyle and tobacco exposure. Current diabetic treatment includes intensive insulin program (an Ozempic). He is compliant with treatment most of the time. His weight is fluctuating minimally. He is following a generally healthy diet. Meal planning includes avoidance of concentrated sweets. He has not had a previous visit with a dietitian. He participates in exercise daily (walks at the mall daily). His home blood glucose trend is decreasing steadily. (He presents today with his CGM and logs showing improved glycemic profile overall.  His previsit A1c on 12/19/21 was 7.4%, improving from last visit of 8.4%.  Analysis of his CGM shows TIR 76%, TAR 22%, TBR 2% with a GMI of 6.8%. He does have episodes of hypoglycemia several times per week.) An ACE inhibitor/angiotensin II receptor blocker is being taken. He does not see a podiatrist.Eye exam is current.  Hyperlipidemia This is a chronic problem. The current episode  started more than 1 year ago. The problem is controlled. Recent lipid tests were reviewed and are normal. Exacerbating diseases include chronic renal disease, diabetes and obesity. Factors aggravating his hyperlipidemia include fatty foods. Pertinent negatives include no chest pain, myalgias or shortness of breath. Current antihyperlipidemic treatment includes statins. The current treatment provides moderate improvement of lipids. There are no compliance problems.  Risk factors for coronary artery disease include dyslipidemia, diabetes mellitus, family history, male sex, obesity, hypertension and a sedentary lifestyle.  Hypertension This is a chronic problem. The current episode started more than 1 year ago. The problem has been gradually improving since onset. The problem is controlled. Pertinent negatives include no blurred vision, chest pain, headaches, neck pain, palpitations or shortness of breath. There are no associated agents to hypertension. Risk factors for coronary artery disease include dyslipidemia, diabetes mellitus, obesity, male gender, smoking/tobacco exposure and sedentary lifestyle. Past treatments include angiotensin blockers and calcium channel blockers. The current treatment provides moderate improvement. There are no compliance problems.  Hypertensive end-organ damage includes  kidney disease. Identifiable causes of hypertension include chronic renal disease.   Review of systems  Constitutional: + Minimally fluctuating body weight,  current Body mass index is 32.08 kg/m. , no fatigue, no subjective hyperthermia, no subjective hypothermia Eyes: no blurry vision, no xerophthalmia ENT: no sore throat, no nodules palpated in throat, no dysphagia/odynophagia, no hoarseness Cardiovascular: no chest pain, no shortness of breath, no palpitations, no leg swelling Respiratory: no cough, no shortness of breath Gastrointestinal: no nausea/vomiting/diarrhea Musculoskeletal: no muscle/joint  aches Skin: no rashes, no hyperemia Neurological: no tremors, no numbness, no tingling, no dizziness Psychiatric: no depression, no anxiety   Objective:    BP 125/67 (BP Location: Left Arm, Patient Position: Sitting, Cuff Size: Large)   Pulse 76   Ht _0  (1.778 m)   Wt 223 lb 9.6 oz (101.4 kg)   BMI 32.08 kg/m   Wt Readings from Last 3 Encounters:  12/21/21 223 lb 9.6 oz (101.4 kg)  09/15/21 226 lb (102.5 kg)  06/09/21 228 lb (103.4 kg)    BP Readings from Last 3 Encounters:  12/21/21 125/67  09/27/21 (!) 106/52  09/15/21 130/75     Physical Exam- Limited  Constitutional:  Body mass index is 32.08 kg/m. , not in acute distress, normal state of mind Eyes:  EOMI, no exophthalmos Neck: Supple Cardiovascular: RRR, no murmurs, rubs, or gallops, no edema Respiratory: Adequate breathing efforts, no crackles, rales, rhonchi, or wheezing Musculoskeletal: no gross deformities, strength intact in all four extremities, no gross restriction of joint movements Skin:  no rashes, no hyperemia Neurological: no tremor with outstretched hands    Recent Results (from the past 2160 hour(s))  Glucose, capillary     Status: Abnormal   Collection Time: 09/27/21 11:13 AM  Result Value Ref Range   Glucose-Capillary 155 (H) 70 - 99 mg/dL    Comment: Glucose reference range applies only to samples taken after fasting for at least 8 hours.  Surgical pathology     Status: None   Collection Time: 09/27/21 11:39 AM  Result Value Ref Range   SURGICAL PATHOLOGY      SURGICAL PATHOLOGY CASE: [id number] PATIENT: [facility] Surgical Pathology Report     Clinical History: hx polyps     FINAL MICROSCOPIC DIAGNOSIS:  A. COLON, TRANSVERSE, POLYPECTOMY: - Polypoid colonic mucosa with mild hyperplastic changes - Negative for dysplasia - Multiple step sections were examined  B. COLON, SIGMOID, POLYPECTOMY: - Hyperplastic polyp(s)       GROSS DESCRIPTION:  A: Received in  formalin is a tan, soft tissue fragment that is submitted in toto.  Size: 0.7 cm, 1 block submitted.  B: Received in formalin are tan, soft tissue fragments that are submitted in toto. Number: 3 size: Range from 0.3 to 0.8 cm blocks: 1 Craig Staggers 09/28/2021)    Final Diagnosis performed by Jaquita Folds, MD.   Electronically signed 09/30/2021 Technical component performed at [facility], [place] [address], [place], [place] [address].  Professional component performed at [facility], [place] [address] [place] , [place] [address].  Immunohistochemistry Technical component (if applicable) was performed at [facility]. [address], [place], [place], [place] [address].   IMMUNOHISTOCHEMISTRY DISCLAIMER (if applicable): Some of these immunohistochemical stains may have been developed and the performance characteristics determine by [facility]. Some may not have been cleared or approved by the U.S. Food and Drug Administration. The FDA has determined that such clearance or approval is not necessary. This test  is used for clinical purposes. It should not be regarded as investigational or for research. This laboratory is certified under the [place] (CLIA-88) as qualified to perform high complexity clinical laboratory testing.  The controls stained appropriately.   Basic metabolic panel     Status: None   Collection Time: 12/19/21 12:00 AM  Result Value Ref Range   Glucose 133    BUN 12 4 - 21   Creatinine 1.1 0.6 - 1.3  Comprehensive metabolic panel     Status: None   Collection Time: 12/19/21 12:00 AM  Result Value Ref Range   eGFR 77   Hemoglobin A1c     Status: None   Collection Time: 12/19/21 12:00 AM  Result Value Ref Range   Hemoglobin A1C 7.4      Recent Results (from the past 2160 hour(s))  Glucose, capillary     Status: Abnormal   Collection Time: 09/27/21 11:13 AM   Result Value Ref Range   Glucose-Capillary 155 (H) 70 - 99 mg/dL    Comment: Glucose reference range applies only to samples taken after fasting for at least 8 hours.  Surgical pathology     Status: None   Collection Time: 09/27/21 11:39 AM  Result Value Ref Range   SURGICAL PATHOLOGY      SURGICAL PATHOLOGY CASE: [id number] PATIENT: [facility] Surgical Pathology Report     Clinical History: hx polyps     FINAL MICROSCOPIC DIAGNOSIS:  A. COLON, TRANSVERSE, POLYPECTOMY: - Polypoid colonic mucosa with mild hyperplastic changes - Negative for dysplasia - Multiple step sections were examined  B. COLON, SIGMOID, POLYPECTOMY: - Hyperplastic polyp(s)       GROSS DESCRIPTION:  A: Received in formalin is a tan, soft tissue fragment that is submitted in toto.  Size: 0.7 cm, 1 block submitted.  B: Received in formalin are tan, soft tissue fragments that are submitted in toto. Number: 3 size: Range from 0.3 to 0.8 cm blocks: 1 Craig Staggers 09/28/2021)    Final Diagnosis performed by Jaquita Folds, MD.   Electronically signed 09/30/2021 Technical component performed at [facility], [place] [address], [place], [place] [address].  Professional component performed at [facility], [place] [address], [place] , [place] [address].  Immunohistochemistry Technical component (if applicable) was performed at [facility]. [address], [place], [place], [place] [address].   IMMUNOHISTOCHEMISTRY DISCLAIMER (if applicable): Some of these immunohistochemical stains may have been developed and the performance characteristics determine by [facility]. Some may not have been cleared or approved by the U.S. Food and Drug Administration. The FDA has determined that such clearance or approval is not necessary. This test is used for clinical purposes. It should not be regarded as investigational or for research. This laboratory  is certified under the [place] (CLIA-88) as qualified to perform high complexity clinical laboratory testing.  The controls stained appropriately.   Basic metabolic panel     Status: None   Collection Time: 12/19/21 12:00 AM  Result Value Ref Range   Glucose 133    BUN 12 4 - 21   Creatinine 1.1 0.6 - 1.3  Comprehensive metabolic panel     Status: None   Collection Time: 12/19/21 12:00 AM  Result Value Ref Range   eGFR 77   Hemoglobin A1c     Status: None   Collection Time: 12/19/21 12:00 AM  Result Value Ref Range  Hemoglobin A1C 7.4       Assessment & Plan:   1) Uncontrolled type 2 diabetes mellitus with hyperglycemia ([place])  - Nicklaus R Carey has currently uncontrolled symptomatic type 2 DM since 66 years of age.  He presents today with his CGM and logs showing improved glycemic profile overall.  His previsit A1c on 12/19/21 was 7.4%, improving from last visit of 8.4%.  Analysis of his CGM shows TIR 76%, TAR 22%, TBR 2% with a GMI of 6.8%. He does have episodes of hypoglycemia several times per week.  - Recent labs reviewed.  -his diabetes is complicated by obesity/sedentary life, and DAAIEL STARLIN remains at a high risk for more acute and chronic complications which include CAD, CVA, CKD, retinopathy, and neuropathy. These are all discussed in detail with the patient.  - Nutritional counseling repeated at each appointment due to patients tendency to fall back in to old habits.  - The patient admits there is a room for improvement in their diet and drink choices. -  Suggestion is made for the patient to avoid simple carbohydrates from their diet including Cakes, Sweet Desserts / Pastries, Ice Cream, Soda (diet and regular), Sweet Tea, Candies, Chips, Cookies, Sweet Pastries, Store Bought Juices, Alcohol in Excess of 1-2 drinks a day, Artificial Sweeteners, Coffee Creamer, and "Sugar-free" Products. This will help patient to have  stable blood glucose profile and potentially avoid unintended weight gain.   - I encouraged the patient to switch to unprocessed or minimally processed complex starch and increased protein intake (animal or plant source), fruits, and vegetables.   - Patient is advised to stick to a routine mealtimes to eat 3 meals a day and avoid unnecessary snacks (to snack only to correct hypoglycemia).  -He follows with a dietitian in [place].  - I have approached him with the following individualized plan to manage diabetes and patient agrees:   -He will continue to require intensive treatment with basal/bolus insulin in order for him to maintain control of diabetes to target.  I did send in for longer pen needles today.  -He is advised to lower his Toujeo to 80 units SQ daily and continue his Humalog 25-31 units TID with meals if glucose is above 90 and he is eating (Specific instructions on how to titrate insulin dosage based on glucose readings given to patient in writing).  He is also advised to continue his Ozempic to 2 mg SQ weekly and Metformin 1000 mg po twice daily with meals.  -He is encouraged to continue using CGM to monitor blood glucose at least 4 times per day, before meals and at bedtime, and call the clinic for readings less than 70 or greater than 200 for 3 tests in a row.  - Patient specific target  A1c;  LDL, HDL, Triglycerides, and  Waist Circumference were discussed in detail.  2) BP/HTN:  His blood pressure is controlled to target.  He is advised to continue Losartan 100 mg po daily and Amlodipine 5 mg po daily.  3) Lipids/HPL:  His most recent lipid panel from 10/05/20 shows controlled LDL at 87 and Triglycerides of 190.  He is advised to continue Atorvastatin 40 mg po daily at bedtime, and Fish Oil 3g po daily.   4)  Weight/Diet:  His Body mass index is 32.08 kg/m.-clearly complicating the care of his diabetes.  He is a candidate for modest weight loss.  CDE Consult will be  initiated , exercise, and detailed carbohydrates information provided.  5) Chronic Care/Health Maintenance: -he is on ACEI/ARB and Statin medications and is encouraged to continue to follow up with Ophthalmology, Dentist,  Podiatrist at least yearly or according to recommendations, and advised to  stay away from smoking. I have recommended yearly flu vaccine and pneumonia vaccination at least every 5 years; moderate intensity exercise for up to 150 minutes weekly; and  sleep for at least 7 hours a day.  - I advised patient to maintain close follow up with Dairl Ponder, MD for primary care needs.     I spent 30 minutes in the care of the patient today including review of labs from [place], Lipids, Thyroid Function, Hematology (current and previous including abstractions from other facilities); face-to-face time discussing  his blood glucose readings/logs, discussing hypoglycemia and hyperglycemia episodes and symptoms, medications doses, his options of short and long term treatment based on the latest standards of care / guidelines;  discussion about incorporating lifestyle medicine;  and documenting the encounter. Risk reduction counseling performed per USPSTF guidelines to reduce obesity and cardiovascular risk factors.     Please refer to Patient Instructions for Blood Glucose Monitoring and Insulin/Medications Dosing Guide"  in media tab for additional information. Please  also refer to " Patient Self Inventory" in the Media  tab for reviewed elements of pertinent patient history.  Rudean Hitt participated in the discussions, expressed understanding, and voiced agreement with the above plans.  All questions were answered to his satisfaction. he is encouraged to contact clinic should he have any questions or concerns prior to his return visit.    Follow up plan: - Return in about 4 months (around 04/21/2022) for Diabetes F/U with A1c in office, No previsit labs, Bring meter and  logs.  Rayetta Pigg, [facility] [address] [place], [place] [address] Phone: [phone] Fax: [phone]  12/21/2021, 1:22 PM

## 2021-12-28 ENCOUNTER — Other Ambulatory Visit: Payer: Self-pay | Admitting: Nurse Practitioner

## 2021-12-28 MED ORDER — NOVOLOG FLEXPEN 100 UNIT/ML ~~LOC~~ SOPN: 25.0000 [IU] | PEN_INJECTOR | Freq: Three times a day (TID) | SUBCUTANEOUS | 2 refills | Status: DC

## 2022-01-16 ENCOUNTER — Other Ambulatory Visit: Payer: Self-pay | Admitting: Nurse Practitioner

## 2022-01-16 DIAGNOSIS — E1165 Type 2 diabetes mellitus with hyperglycemia: Secondary | ICD-10-CM

## 2022-03-22 ENCOUNTER — Telehealth: Payer: Self-pay | Admitting: "Endocrinology

## 2022-03-22 NOTE — Telephone Encounter (Signed)
Pt picked up Ozempic.

## 2022-03-22 NOTE — Telephone Encounter (Signed)
Left vm for pt that he has pt assistance ozempic in 2nd fridge

## 2022-04-11 ENCOUNTER — Other Ambulatory Visit: Payer: Self-pay | Admitting: Nurse Practitioner

## 2022-04-11 DIAGNOSIS — E1165 Type 2 diabetes mellitus with hyperglycemia: Secondary | ICD-10-CM

## 2022-04-25 LAB — BASIC METABOLIC PANEL
BUN: 10 (ref 4–21)
Creatinine: 1 (ref 0.6–1.3)
Glucose: 184

## 2022-04-25 LAB — COMPREHENSIVE METABOLIC PANEL: eGFR: 82

## 2022-04-25 LAB — TSH: TSH: 0.73 (ref 0.41–5.90)

## 2022-04-25 LAB — VITAMIN D 25 HYDROXY (VIT D DEFICIENCY, FRACTURES): Vit D, 25-Hydroxy: 36

## 2022-04-25 LAB — LIPID PANEL
LDL Cholesterol: 23
Triglycerides: 121 (ref 40–160)

## 2022-04-25 LAB — HEMOGLOBIN A1C: Hemoglobin A1C: 8

## 2022-04-26 LAB — LAB REPORT - SCANNED
A1c: 8
EGFR: 82

## 2022-04-27 ENCOUNTER — Ambulatory Visit: Payer: Medicare Other | Admitting: Nurse Practitioner

## 2022-04-27 ENCOUNTER — Encounter: Payer: Self-pay | Admitting: Nurse Practitioner

## 2022-04-27 ENCOUNTER — Ambulatory Visit (INDEPENDENT_AMBULATORY_CARE_PROVIDER_SITE_OTHER): Payer: Medicare Other | Admitting: Nurse Practitioner

## 2022-04-27 VITALS — BP 143/75 | HR 74 | Ht 70.0 in | Wt 222.6 lb

## 2022-04-27 DIAGNOSIS — E559 Vitamin D deficiency, unspecified: Secondary | ICD-10-CM

## 2022-04-27 DIAGNOSIS — I10 Essential (primary) hypertension: Secondary | ICD-10-CM

## 2022-04-27 DIAGNOSIS — E1165 Type 2 diabetes mellitus with hyperglycemia: Secondary | ICD-10-CM

## 2022-04-27 DIAGNOSIS — E782 Mixed hyperlipidemia: Secondary | ICD-10-CM | POA: Diagnosis not present

## 2022-04-27 NOTE — Progress Notes (Signed)
04/27/2022, 11:50 AM       Endocrinology follow-up note    Subjective:    Patient ID: Todd Carey, male    DOB: 03-17-1955.   Todd Carey is being seen in follow-up for management of currently uncontrolled symptomatic type 2 diabetes, hyperlipidemia, hypertension. PMD:  Dairl Ponder, MD.   Past Medical History:  Diagnosis Date   Arthritis    Right shoulder    Diabetes mellitus    Hypercholesteremia    Past Surgical History:  Procedure Laterality Date   COLONOSCOPY     COLONOSCOPY N/A 02/08/2016   Procedure: COLONOSCOPY;  Surgeon: Danie Binder, MD;  Location: [facility];  Service: Endoscopy;  Laterality: N/A;  10:00 Am   COLONOSCOPY WITH PROPOFOL N/A 09/27/2021   Procedure: COLONOSCOPY WITH PROPOFOL;  Surgeon: Eloise Harman, DO;  Location: [facility];  Service: Endoscopy;  Laterality: N/A;  12:15pm.   POLYPECTOMY  09/27/2021   Procedure: POLYPECTOMY;  Surgeon: Eloise Harman, DO;  Location: [facility];  Service: Endoscopy;;   Social History   Socioeconomic History   Marital status: Married    Spouse name: Not on file   Number of children: Not on file   Years of education: Not on file   Highest education level: Not on file  Occupational History   Not on file  Tobacco Use   Smoking status: Former    Packs/day: 1.00    Years: 38.00    Additional pack years: 0.00    Total pack years: 38.00    Types: Cigarettes    Quit date: 04/18/2013    Years since quitting: 9.0   Smokeless tobacco: Former  Scientific laboratory technician Use: Never used  Substance and Sexual Activity   Alcohol use: No   Drug use: No   Sexual activity: Not on file  Other Topics Concern   Not on file  Social History Narrative   Not on file   Social Determinants of Health   Financial Resource Strain: Not on file  Food Insecurity: Not on file  Transportation Needs: Not on file  Physical Activity: Not on file  Stress: Not on file  Social  Connections: Not on file   Outpatient Encounter Medications as of 04/27/2022  Medication Sig   amLODipine (NORVASC) 5 MG tablet Take 5 mg by mouth daily.   aspirin 81 MG tablet Take 81 mg by mouth daily.   atorvastatin (LIPITOR) 40 MG tablet Take 40 mg by mouth daily.   Cholecalciferol (VITAMIN D) 2000 units tablet Take 2,000 Units by mouth daily.   Continuous Blood Gluc Receiver (FREESTYLE LIBRE 2 READER) DEVI USE TO CHECK GLUCOSE AS DIRECTED   Continuous Blood Gluc Sensor (FREESTYLE LIBRE 2 SENSOR) MISC USE TO CHECK GLUCOSE FOUR TIMES DAILY AS DIRECTED. CHANGE SENSOR EVERY 14 DAYS.   fish oil-omega-3 fatty acids 1000 MG capsule Take 3 g by mouth daily.   insulin glargine, 2 Unit Dial, (TOUJEO MAX SOLOSTAR) 300 UNIT/ML Solostar Pen Inject 80 Units into the skin at bedtime.   Insulin Pen Needle (PEN NEEDLES) 31G X 8 MM MISC Use to inject insulin 4 times daily   losartan (COZAAR) 100 MG tablet Take 100 mg by mouth daily.   metFORMIN (GLUCOPHAGE) 1000 MG tablet Take 1 tablet (1,000 mg total) by mouth 2 (two) times daily with a meal.   Semaglutide, 2 MG/DOSE, (OZEMPIC, 2 MG/DOSE,) 8 MG/3ML SOPN Inject 2 mg into the  skin every Saturday.   insulin aspart (FIASP FLEXTOUCH) 100 UNIT/ML FlexTouch Pen Inject 25-31 Units into the skin 3 (three) times daily with meals. (Patient not taking: Reported on 04/27/2022)   No facility-administered encounter medications on file as of 04/27/2022.    ALLERGIES: No Known Allergies  VACCINATION STATUS: Immunization History  Administered Date(s) Administered   Influenza,inj,Quad PF,6+ Mos 09/05/2018   Moderna Covid-19 Vaccine Bivalent Booster 86yrs & up 11/09/2020   Moderna Sars-Covid-2 Vaccination 04/16/2019, 12/02/2019, 05/25/2020   Zoster Recombinat (Shingrix) 06/20/2018, 08/24/2018    Diabetes He presents for his follow-up diabetic visit. He has type 2 diabetes mellitus. Onset time: He was diagnosed at approximate age of 22 years. His disease course has  been improving. There are no hypoglycemic associated symptoms. Pertinent negatives for hypoglycemia include no confusion, headaches, pallor or seizures. Associated symptoms include fatigue. Pertinent negatives for diabetes include no blurred vision, no chest pain, no polydipsia, no polyphagia, no polyuria and no weakness. There are no hypoglycemic complications. Symptoms are stable. There are no diabetic complications. Risk factors for coronary artery disease include diabetes mellitus, dyslipidemia, family history, male sex, hypertension, obesity, sedentary lifestyle and tobacco exposure. Current diabetic treatment includes intensive insulin program and oral agent (monotherapy) (an Ozempic). He is compliant with treatment most of the time. His weight is decreasing steadily. He is following a generally healthy diet. Meal planning includes avoidance of concentrated sweets. He has not had a previous visit with a dietitian. He participates in exercise daily (walks at the mall daily). His home blood glucose trend is decreasing steadily. His overall blood glucose range is 140-180 mg/dl. (He presents today, accompanied by his wife, with his CGM showing at goal glycemic profile with some tightening readings.  His recent A1c, checked at his PCP office on 3/26 was 8%, increasing slightly.  He notes he did have cold in January and took OTC treatment which elevated his glucose.  Analysis of his CGM shows TIR 73%, TAR 23%, TBR 4% with a GMI of 6.8%.) An ACE inhibitor/angiotensin II receptor blocker is being taken. He does not see a podiatrist.Eye exam is current.  Hyperlipidemia This is a chronic problem. The current episode started more than 1 year ago. The problem is controlled. Recent lipid tests were reviewed and are normal. Exacerbating diseases include chronic renal disease, diabetes and obesity. Factors aggravating his hyperlipidemia include fatty foods. Pertinent negatives include no chest pain, myalgias or shortness  of breath. Current antihyperlipidemic treatment includes statins. The current treatment provides moderate improvement of lipids. There are no compliance problems.  Risk factors for coronary artery disease include dyslipidemia, diabetes mellitus, family history, male sex, obesity, hypertension and a sedentary lifestyle.  Hypertension This is a chronic problem. The current episode started more than 1 year ago. The problem has been gradually improving since onset. The problem is controlled. Pertinent negatives include no blurred vision, chest pain, headaches, neck pain, palpitations or shortness of breath. There are no associated agents to hypertension. Risk factors for coronary artery disease include dyslipidemia, diabetes mellitus, obesity, male gender, smoking/tobacco exposure and sedentary lifestyle. Past treatments include angiotensin blockers and calcium channel blockers. The current treatment provides moderate improvement. There are no compliance problems.  Hypertensive end-organ damage includes kidney disease. Identifiable causes of hypertension include chronic renal disease.   Review of systems  Constitutional: + decreasing body weight,  current Body mass index is 31.94 kg/m. , no fatigue, no subjective hyperthermia, no subjective hypothermia Eyes: no blurry vision, no xerophthalmia ENT: no sore throat,  no nodules palpated in throat, no dysphagia/odynophagia, no hoarseness Cardiovascular: no chest pain, no shortness of breath, no palpitations, no leg swelling Respiratory: no cough, no shortness of breath Gastrointestinal: no nausea/vomiting/diarrhea Musculoskeletal: no muscle/joint aches Skin: no rashes, no hyperemia Neurological: no tremors, no numbness, no tingling, no dizziness Psychiatric: no depression, no anxiety   Objective:    BP (!) 143/75 (BP Location: Right Arm, Patient Position: Sitting, Cuff Size: Large)   Pulse 74   Ht 5\' 10"  (1.778 m)   Wt 222 lb 9.6 oz (101 kg)   BMI  31.94 kg/m   Wt Readings from Last 3 Encounters:  04/27/22 222 lb 9.6 oz (101 kg)  12/21/21 223 lb 9.6 oz (101.4 kg)  09/15/21 226 lb (102.5 kg)    BP Readings from Last 3 Encounters:  04/27/22 (!) 143/75  12/21/21 125/67  09/27/21 (!) 106/52     Physical Exam- Limited  Constitutional:  Body mass index is 31.94 kg/m. , not in acute distress, normal state of mind Eyes:  EOMI, no exophthalmos Neck: Supple Musculoskeletal: no gross deformities, strength intact in all four extremities, no gross restriction of joint movements Skin:  no rashes, no hyperemia Neurological: no tremor with outstretched hands  Diabetic Foot Exam - Simple   Simple Foot Form Diabetic Foot exam was performed with the following findings: Yes 04/27/2022 11:32 AM  Visual Inspection No deformities, no ulcerations, no other skin breakdown bilaterally: Yes Sensation Testing Intact to touch and monofilament testing bilaterally: Yes Pulse Check Posterior Tibialis and Dorsalis pulse intact bilaterally: Yes Comments     Recent Results (from the past 2160 hour(s))  VITAMIN D 25 Hydroxy (Vit-D Deficiency, Fractures)     Status: None   Collection Time: 04/25/22 12:00 AM  Result Value Ref Range   Vit D, 25-Hydroxy 36   Basic metabolic panel     Status: None   Collection Time: 04/25/22 12:00 AM  Result Value Ref Range   Glucose 184    BUN 10 4 - 21   Creatinine 1.0 0.6 - 1.3  Comprehensive metabolic panel     Status: None   Collection Time: 04/25/22 12:00 AM  Result Value Ref Range   eGFR 82   Lipid panel     Status: None   Collection Time: 04/25/22 12:00 AM  Result Value Ref Range   Triglycerides 121 40 - 160   LDL Cholesterol 23   Hemoglobin A1c     Status: None   Collection Time: 04/25/22 12:00 AM  Result Value Ref Range   Hemoglobin A1C 8   TSH     Status: None   Collection Time: 04/25/22 12:00 AM  Result Value Ref Range   TSH 0.73 0.41 - 5.90     Recent Results (from the past 2160 hour(s))   VITAMIN D 25 Hydroxy (Vit-D Deficiency, Fractures)     Status: None   Collection Time: 04/25/22 12:00 AM  Result Value Ref Range   Vit D, 25-Hydroxy 36   Basic metabolic panel     Status: None   Collection Time: 04/25/22 12:00 AM  Result Value Ref Range   Glucose 184    BUN 10 4 - 21   Creatinine 1.0 0.6 - 1.3  Comprehensive metabolic panel     Status: None   Collection Time: 04/25/22 12:00 AM  Result Value Ref Range   eGFR 82   Lipid panel     Status: None   Collection Time: 04/25/22 12:00 AM  Result Value  Ref Range   Triglycerides 121 40 - 160   LDL Cholesterol 23   Hemoglobin A1c     Status: None   Collection Time: 04/25/22 12:00 AM  Result Value Ref Range   Hemoglobin A1C 8   TSH     Status: None   Collection Time: 04/25/22 12:00 AM  Result Value Ref Range   TSH 0.73 0.41 - 5.90      Assessment & Plan:   1) Uncontrolled type 2 diabetes mellitus with hyperglycemia (Todd Carey)  - Todd Carey has currently uncontrolled symptomatic type 2 DM since 67 years of age.  He presents today, accompanied by his wife, with his CGM showing at goal glycemic profile with some tightening readings.  His recent A1c, checked at his PCP office on 3/26 was 8%, increasing slightly.  He notes he did have cold in January and took OTC treatment which elevated his glucose.  Analysis of his CGM shows TIR 73%, TAR 23%, TBR 4% with a GMI of 6.8%.  - Recent labs reviewed.  -his diabetes is complicated by obesity/sedentary life, and Todd Carey remains at a high risk for more acute and chronic complications which include CAD, CVA, CKD, retinopathy, and neuropathy. These are all discussed in detail with the patient.  - Nutritional counseling repeated at each appointment due to patients tendency to fall back in to old habits.  - The patient admits there is a room for improvement in their diet and drink choices. -  Suggestion is made for the patient to avoid simple carbohydrates from their diet  including Cakes, Sweet Desserts / Pastries, Ice Cream, Soda (diet and regular), Sweet Tea, Candies, Chips, Cookies, Sweet Pastries, Store Bought Juices, Alcohol in Excess of 1-2 drinks a day, Artificial Sweeteners, Coffee Creamer, and "Sugar-free" Products. This will help patient to have stable blood glucose profile and potentially avoid unintended weight gain.   - I encouraged the patient to switch to unprocessed or minimally processed complex starch and increased protein intake (animal or plant source), fruits, and vegetables.   - Patient is advised to stick to a routine mealtimes to eat 3 meals a day and avoid unnecessary snacks (to snack only to correct hypoglycemia).  -He follows with a dietitian in [place].  - I have approached him with the following individualized plan to manage diabetes and patient agrees:   -He will continue to require intensive treatment with basal/bolus insulin in order for him to maintain control of diabetes to target.  I did send in for longer pen needles today.  -He is advised to continue his Toujeo to 80 units SQ daily and lower his Humalog to 15-21 units TID with meals if glucose is above 90 and he is eating (Specific instructions on how to titrate insulin dosage based on glucose readings given to patient in writing).  He is also advised to continue his Ozempic to 2 mg SQ weekly and Metformin 1000 mg po twice daily with meals.  -Will be changing his Toujeo to [place] and Humalog to Novolog so he can get all his medications from [facility] with [facility] since he already gets his Ozempic from there.  -He is encouraged to continue using CGM to monitor blood glucose at least 4 times per day, before meals and at bedtime, and call the clinic for readings less than 70 or greater than 200 for 3 tests in a row.  - Patient specific target  A1c;  LDL, HDL, Triglycerides, and  Waist Circumference were discussed  in detail.  2) BP/HTN:  His blood pressure is controlled to  target.  He is advised to continue Losartan 100 mg po daily and Amlodipine 5 mg po daily.  3) Lipids/HPL:  His most recent lipid panel from 04/25/22 shows controlled LDL at 23 and Triglycerides of 121.  He is advised to continue Atorvastatin 40 mg po daily at bedtime, and Fish Oil 3g po daily.   4)  Weight/Diet:  His Body mass index is 31.94 kg/m.-clearly complicating the care of his diabetes.  He is a candidate for modest weight loss.  CDE Consult will be initiated , exercise, and detailed carbohydrates information provided.  5) Chronic Care/Health Maintenance: -he is on ACEI/ARB and Statin medications and is encouraged to continue to follow up with Ophthalmology, Dentist,  Podiatrist at least yearly or according to recommendations, and advised to  stay away from smoking. I have recommended yearly flu vaccine and pneumonia vaccination at least every 5 years; moderate intensity exercise for up to 150 minutes weekly; and  sleep for at least 7 hours a day.  - I advised patient to maintain close follow up with Dairl Ponder, MD for primary care needs.      I spent  21  minutes in the care of the patient today including review of labs from [place], Lipids, Thyroid Function, Hematology (current and previous including abstractions from other facilities); face-to-face time discussing  his blood glucose readings/logs, discussing hypoglycemia and hyperglycemia episodes and symptoms, medications doses, his options of short and long term treatment based on the latest standards of care / guidelines;  discussion about incorporating lifestyle medicine;  and documenting the encounter. Risk reduction counseling performed per USPSTF guidelines to reduce obesity and cardiovascular risk factors.     Please refer to Patient Instructions for Blood Glucose Monitoring and Insulin/Medications Dosing Guide"  in media tab for additional information. Please  also refer to " Patient Self Inventory" in the Media  tab for  reviewed elements of pertinent patient history.  Todd Carey participated in the discussions, expressed understanding, and voiced agreement with the above plans.  All questions were answered to his satisfaction. he is encouraged to contact clinic should he have any questions or concerns prior to his return visit.    Follow up plan: - Return in about 4 months (around 08/27/2022) for Diabetes F/U with A1c in office, No previsit labs, Bring meter and logs.  Rayetta Pigg, [facility] [address] [place], [place] [address] Phone: [phone] Fax: [phone]  04/27/2022, 11:50 AM

## 2022-07-03 ENCOUNTER — Other Ambulatory Visit: Payer: Self-pay | Admitting: Nurse Practitioner

## 2022-07-03 DIAGNOSIS — E1165 Type 2 diabetes mellitus with hyperglycemia: Secondary | ICD-10-CM

## 2022-07-28 ENCOUNTER — Telehealth: Payer: Self-pay | Admitting: Nurse Practitioner

## 2022-07-28 NOTE — Telephone Encounter (Signed)
Left vm that his pt assistance ozempic is ready for pu

## 2022-08-31 ENCOUNTER — Ambulatory Visit: Payer: Medicare Other | Admitting: Nurse Practitioner

## 2022-08-31 ENCOUNTER — Encounter: Payer: Self-pay | Admitting: Nurse Practitioner

## 2022-08-31 VITALS — BP 120/69 | HR 73 | Ht 70.0 in | Wt 222.8 lb

## 2022-08-31 DIAGNOSIS — I10 Essential (primary) hypertension: Secondary | ICD-10-CM | POA: Diagnosis not present

## 2022-08-31 DIAGNOSIS — E782 Mixed hyperlipidemia: Secondary | ICD-10-CM | POA: Diagnosis not present

## 2022-08-31 DIAGNOSIS — Z794 Long term (current) use of insulin: Secondary | ICD-10-CM

## 2022-08-31 DIAGNOSIS — Z7984 Long term (current) use of oral hypoglycemic drugs: Secondary | ICD-10-CM

## 2022-08-31 DIAGNOSIS — E1165 Type 2 diabetes mellitus with hyperglycemia: Secondary | ICD-10-CM

## 2022-08-31 DIAGNOSIS — Z7985 Long-term (current) use of injectable non-insulin antidiabetic drugs: Secondary | ICD-10-CM

## 2022-08-31 LAB — POCT GLYCOSYLATED HEMOGLOBIN (HGB A1C): Hemoglobin A1C: 7.9 % — AB (ref 4.0–5.6)

## 2022-08-31 LAB — POCT UA - MICROALBUMIN

## 2022-08-31 NOTE — Progress Notes (Signed)
08/31/2022, 1:33 PM       Endocrinology follow-up note    Subjective:    Patient ID: Todd Carey, male    DOB: 1955/03/09.   Todd Carey is being seen in follow-up for management of currently uncontrolled symptomatic type 2 diabetes, hyperlipidemia, hypertension. PMD:  Craig Staggers, MD.   Past Medical History:  Diagnosis Date   Arthritis    Right shoulder    Diabetes mellitus    Hypercholesteremia    Past Surgical History:  Procedure Laterality Date   COLONOSCOPY     COLONOSCOPY N/A 02/08/2016   Procedure: COLONOSCOPY;  Surgeon: West Bali, MD;  Location: [facility];  Service: Endoscopy;  Laterality: N/A;  10:00 Am   COLONOSCOPY WITH PROPOFOL N/A 09/27/2021   Procedure: COLONOSCOPY WITH PROPOFOL;  Surgeon: Lanelle Bal, DO;  Location: [facility];  Service: Endoscopy;  Laterality: N/A;  12:15pm.   POLYPECTOMY  09/27/2021   Procedure: POLYPECTOMY;  Surgeon: Lanelle Bal, DO;  Location: [facility];  Service: Endoscopy;;   Social History   Socioeconomic History   Marital status: Married    Spouse name: Not on file   Number of children: Not on file   Years of education: Not on file   Highest education level: Not on file  Occupational History   Not on file  Tobacco Use   Smoking status: Former    Current packs/day: 0.00    Average packs/day: 1 pack/day for 38.0 years (38.0 ttl pk-yrs)    Types: Cigarettes    Start date: 04/19/1975    Quit date: 04/18/2013    Years since quitting: 9.3   Smokeless tobacco: Former  Building services engineer status: Never Used  Substance and Sexual Activity   Alcohol use: No   Drug use: No   Sexual activity: Not on file  Other Topics Concern   Not on file  Social History Narrative   Not on file   Social Determinants of Health   Financial Resource Strain: Not on file  Food Insecurity: Not on file  Transportation Needs: Not on file  Physical Activity: Not on file  Stress: Not on  file  Social Connections: Not on file   Outpatient Encounter Medications as of 08/31/2022  Medication Sig   amLODipine (NORVASC) 5 MG tablet Take 5 mg by mouth daily.   aspirin 81 MG tablet Take 81 mg by mouth daily.   atorvastatin (LIPITOR) 40 MG tablet Take 40 mg by mouth daily.   Cholecalciferol (VITAMIN D) 2000 units tablet Take 2,000 Units by mouth daily.   Continuous Blood Gluc Receiver (FREESTYLE LIBRE 2 READER) DEVI USE TO CHECK GLUCOSE AS DIRECTED   Continuous Glucose Sensor (FREESTYLE LIBRE 2 SENSOR) MISC USE TO CHECK GLUCOSE FOUR TIMES DAILY AS DIRECTED. CHANGE SENSOR EVERY 14 DAYS.   fish oil-omega-3 fatty acids 1000 MG capsule Take 3 g by mouth daily.   insulin glargine, 2 Unit Dial, (TOUJEO MAX SOLOSTAR) 300 UNIT/ML Solostar Pen Inject 80 Units into the skin at bedtime.   insulin lispro (HUMALOG KWIKPEN) 200 UNIT/ML KwikPen Inject 15-21 Units into the skin 3 (three) times daily.   Insulin Pen Needle (PEN NEEDLES) 31G X 8 MM MISC Use to inject insulin 4 times daily   losartan (COZAAR) 100 MG tablet Take 100 mg by mouth daily.   metFORMIN (GLUCOPHAGE) 1000 MG tablet Take 1 tablet (1,000 mg total) by mouth 2 (two) times daily  with a meal.   Semaglutide, 2 MG/DOSE, (OZEMPIC, 2 MG/DOSE,) 8 MG/3ML SOPN Inject 2 mg into the skin every Saturday.   insulin aspart (FIASP FLEXTOUCH) 100 UNIT/ML FlexTouch Pen Inject 25-31 Units into the skin 3 (three) times daily with meals. (Patient not taking: Reported on 04/27/2022)   No facility-administered encounter medications on file as of 08/31/2022.    ALLERGIES: No Known Allergies  VACCINATION STATUS: Immunization History  Administered Date(s) Administered   Influenza,inj,Quad PF,6+ Mos 09/05/2018   Moderna Covid-19 Vaccine Bivalent Booster 44yrs & up 11/09/2020   Moderna Sars-Covid-2 Vaccination 04/16/2019, 12/02/2019, 05/25/2020   Zoster Recombinant(Shingrix) 06/20/2018, 08/24/2018    Diabetes He presents for his follow-up diabetic  visit. He has type 2 diabetes mellitus. Onset time: He was diagnosed at approximate age of 88 years. His disease course has been improving. There are no hypoglycemic associated symptoms. Pertinent negatives for hypoglycemia include no confusion, headaches, pallor or seizures. Associated symptoms include fatigue. Pertinent negatives for diabetes include no blurred vision, no chest pain, no polydipsia, no polyphagia, no polyuria and no weakness. There are no hypoglycemic complications. Symptoms are stable. There are no diabetic complications. Risk factors for coronary artery disease include diabetes mellitus, dyslipidemia, family history, male sex, hypertension, obesity, sedentary lifestyle and tobacco exposure. Current diabetic treatment includes intensive insulin program and oral agent (monotherapy) (an Ozempic). He is compliant with treatment most of the time. His weight is stable. He is following a generally healthy diet. Meal planning includes avoidance of concentrated sweets. He has not had a previous visit with a dietitian. He participates in exercise daily (walks at the mall daily). His home blood glucose trend is decreasing steadily. His breakfast blood glucose range is generally 70-90 mg/dl. His overall blood glucose range is 140-180 mg/dl. (He presents today, with his CGM showing at goal glycemic profile with some tightening readings.  His POCT A1c today is 7.9%, improving from alst visit of 8%.  Analysis of his CGM shows TIR 47%, TAR 52%, TBR 1% with a GMI of 7.8%.  He notes his glucose spiked during the heat wave in July and he did not change anything with his diet. ) An ACE inhibitor/angiotensin II receptor blocker is being taken. He does not see a podiatrist.Eye exam is current.  Hyperlipidemia This is a chronic problem. The current episode started more than 1 year ago. The problem is controlled. Recent lipid tests were reviewed and are normal. Exacerbating diseases include chronic renal disease,  diabetes and obesity. Factors aggravating his hyperlipidemia include fatty foods. Pertinent negatives include no chest pain, myalgias or shortness of breath. Current antihyperlipidemic treatment includes statins. The current treatment provides moderate improvement of lipids. There are no compliance problems.  Risk factors for coronary artery disease include dyslipidemia, diabetes mellitus, family history, male sex, obesity, hypertension and a sedentary lifestyle.  Hypertension This is a chronic problem. The current episode started more than 1 year ago. The problem has been gradually improving since onset. The problem is controlled. Pertinent negatives include no blurred vision, chest pain, headaches, neck pain, palpitations or shortness of breath. There are no associated agents to hypertension. Risk factors for coronary artery disease include dyslipidemia, diabetes mellitus, obesity, male gender, smoking/tobacco exposure and sedentary lifestyle. Past treatments include angiotensin blockers and calcium channel blockers. The current treatment provides moderate improvement. There are no compliance problems.  Hypertensive end-organ damage includes kidney disease. Identifiable causes of hypertension include chronic renal disease.   Review of systems  Constitutional: + stable body weight,  current  Body mass index is 31.97 kg/m. , no fatigue, no subjective hyperthermia, no subjective hypothermia Eyes: no blurry vision, no xerophthalmia ENT: no sore throat, no nodules palpated in throat, no dysphagia/odynophagia, no hoarseness Cardiovascular: no chest pain, no shortness of breath, no palpitations, no leg swelling Respiratory: no cough, no shortness of breath Gastrointestinal: no nausea/vomiting/diarrhea Musculoskeletal: no muscle/joint aches Skin: no rashes, no hyperemia Neurological: no tremors, no numbness, no tingling, no dizziness Psychiatric: no depression, no anxiety   Objective:    BP 120/69 (BP  Location: Left Arm, Patient Position: Sitting, Cuff Size: Large)   Pulse 73   Ht 5\' 10"  (1.778 m)   Wt 222 lb 12.8 oz (101.1 kg)   BMI 31.97 kg/m   Wt Readings from Last 3 Encounters:  08/31/22 222 lb 12.8 oz (101.1 kg)  04/27/22 222 lb 9.6 oz (101 kg)  12/21/21 223 lb 9.6 oz (101.4 kg)    BP Readings from Last 3 Encounters:  08/31/22 120/69  04/27/22 (!) 143/75  12/21/21 125/67     Physical Exam- Limited  Constitutional:  Body mass index is 31.97 kg/m. , not in acute distress, normal state of mind Eyes:  EOMI, no exophthalmos Musculoskeletal: no gross deformities, strength intact in all four extremities, no gross restriction of joint movements Skin:  no rashes, no hyperemia Neurological: no tremor with outstretched hands  Diabetic Foot Exam - Simple   No data filed     Recent Results (from the past 2160 hour(s))  POCT UA - Microalbumin     Status: Abnormal   Collection Time: 08/31/22  1:23 PM  Result Value Ref Range   Microalbumin Ur, POC 150mg /L mg/L   Creatinine, POC 200mg /dL mg/dL   Albumin/Creatinine Ratio, Urine, POC 30-300mg /G   HgB A1c     Status: Abnormal   Collection Time: 08/31/22  1:24 PM  Result Value Ref Range   Hemoglobin A1C 7.9 (A) 4.0 - 5.6 %   HbA1c POC (<> result, manual entry)     HbA1c, POC (prediabetic range)     HbA1c, POC (controlled diabetic range)        Recent Results (from the past 2160 hour(s))  POCT UA - Microalbumin     Status: Abnormal   Collection Time: 08/31/22  1:23 PM  Result Value Ref Range   Microalbumin Ur, POC 150mg /L mg/L   Creatinine, POC 200mg /dL mg/dL   Albumin/Creatinine Ratio, Urine, POC 30-300mg /G   HgB A1c     Status: Abnormal   Collection Time: 08/31/22  1:24 PM  Result Value Ref Range   Hemoglobin A1C 7.9 (A) 4.0 - 5.6 %   HbA1c POC (<> result, manual entry)     HbA1c, POC (prediabetic range)     HbA1c, POC (controlled diabetic range)         Assessment & Plan:   1) Uncontrolled type 2 diabetes  mellitus with hyperglycemia (HCC)  - Todd Carey has currently uncontrolled symptomatic type 2 DM since 67 years of age.  He presents today, with his CGM showing at goal glycemic profile with some tightening readings.  His POCT A1c today is 7.9%, improving from alst visit of 8%.  Analysis of his CGM shows TIR 47%, TAR 52%, TBR 1% with a GMI of 7.8%.  He notes his glucose spiked during the heat wave in July and he did not change anything with his diet.   - Recent labs reviewed.  -his diabetes is complicated by obesity/sedentary life, and Todd Carey remains at  a high risk for more acute and chronic complications which include CAD, CVA, CKD, retinopathy, and neuropathy. These are all discussed in detail with the patient.  - Nutritional counseling repeated at each appointment due to patients tendency to fall back in to old habits.  - The patient admits there is a room for improvement in their diet and drink choices. -  Suggestion is made for the patient to avoid simple carbohydrates from their diet including Cakes, Sweet Desserts / Pastries, Ice Cream, Soda (diet and regular), Sweet Tea, Candies, Chips, Cookies, Sweet Pastries, Store Bought Juices, Alcohol in Excess of 1-2 drinks a day, Artificial Sweeteners, Coffee Creamer, and "Sugar-free" Products. This will help patient to have stable blood glucose profile and potentially avoid unintended weight gain.   - I encouraged the patient to switch to unprocessed or minimally processed complex starch and increased protein intake (animal or plant source), fruits, and vegetables.   - Patient is advised to stick to a routine mealtimes to eat 3 meals a day and avoid unnecessary snacks (to snack only to correct hypoglycemia).  -He follows with a dietitian in [place].  - I have approached him with the following individualized plan to manage diabetes and patient agrees:   -He will continue to require intensive treatment with basal/bolus insulin in  order for him to maintain control of diabetes to target.  I did send in for longer pen needles today.  -He is advised to lower his Toujeo to 70 units (will change to [place] and send to [facility] where he gets his Ozempic from already) SQ daily and lower his Humalog to 10-16 units (will change to Novolog and send to [facility]) TID with meals if glucose is above 90 and he is eating (Specific instructions on how to titrate insulin dosage based on glucose readings given to patient in writing).  He is also advised to continue his Ozempic to 2 mg SQ weekly and Metformin 1000 mg po twice daily with meals.  -He is encouraged to continue using CGM to monitor blood glucose at least 4 times per day, before meals and at bedtime, and call the clinic for readings less than 70 or greater than 200 for 3 tests in a row.  - Patient specific target  A1c;  LDL, HDL, Triglycerides, and  Waist Circumference were discussed in detail.  2) BP/HTN:  His blood pressure is controlled to target.  He is advised to continue Losartan 100 mg po daily and Amlodipine 5 mg po daily.  3) Lipids/HPL:  His most recent lipid panel from 04/25/22 shows controlled LDL at 23 and Triglycerides of 121.  He is advised to continue Atorvastatin 40 mg po daily at bedtime, and Fish Oil 3g po daily.   4)  Weight/Diet:  His Body mass index is 31.97 kg/m.-clearly complicating the care of his diabetes.  He is a candidate for modest weight loss.  CDE Consult will be initiated , exercise, and detailed carbohydrates information provided.  5) Chronic Care/Health Maintenance: -he is on ACEI/ARB and Statin medications and is encouraged to continue to follow up with Ophthalmology, Dentist,  Podiatrist at least yearly or according to recommendations, and advised to  stay away from smoking. I have recommended yearly flu vaccine and pneumonia vaccination at least every 5 years; moderate intensity exercise for up to 150 minutes weekly; and  sleep for at least 7 hours a  day.  - I advised patient to maintain close follow up with Craig Staggers, MD for primary care needs.  I spent  32  minutes in the care of the patient today including review of labs from CMP, Lipids, Thyroid Function, Hematology (current and previous including abstractions from other facilities); face-to-face time discussing  his blood glucose readings/logs, discussing hypoglycemia and hyperglycemia episodes and symptoms, medications doses, his options of short and long term treatment based on the latest standards of care / guidelines;  discussion about incorporating lifestyle medicine;  and documenting the encounter. Risk reduction counseling performed per USPSTF guidelines to reduce obesity and cardiovascular risk factors.     Please refer to Patient Instructions for Blood Glucose Monitoring and Insulin/Medications Dosing Guide"  in media tab for additional information. Please  also refer to " Patient Self Inventory" in the Media  tab for reviewed elements of pertinent patient history.  Todd Carey participated in the discussions, expressed understanding, and voiced agreement with the above plans.  All questions were answered to his satisfaction. he is encouraged to contact clinic should he have any questions or concerns prior to his return visit.    Follow up plan: - Return in about 4 months (around 12/31/2022) for Diabetes F/U with A1c in office, No previsit labs, Bring meter and logs.  Ronny Bacon, [facility] [address] [place], [place] [address] Phone: [phone] Fax: [phone]  08/31/2022, 1:33 PM

## 2022-09-16 IMAGING — DX DG KNEE COMPLETE 4+V*R*
4 series · 4 of 4 positions shown · non-contrast
Comparison: None Available.

CLINICAL DATA: Trauma, pain

EXAM:
RIGHT KNEE - COMPLETE 4+ VIEW

[knee ap]
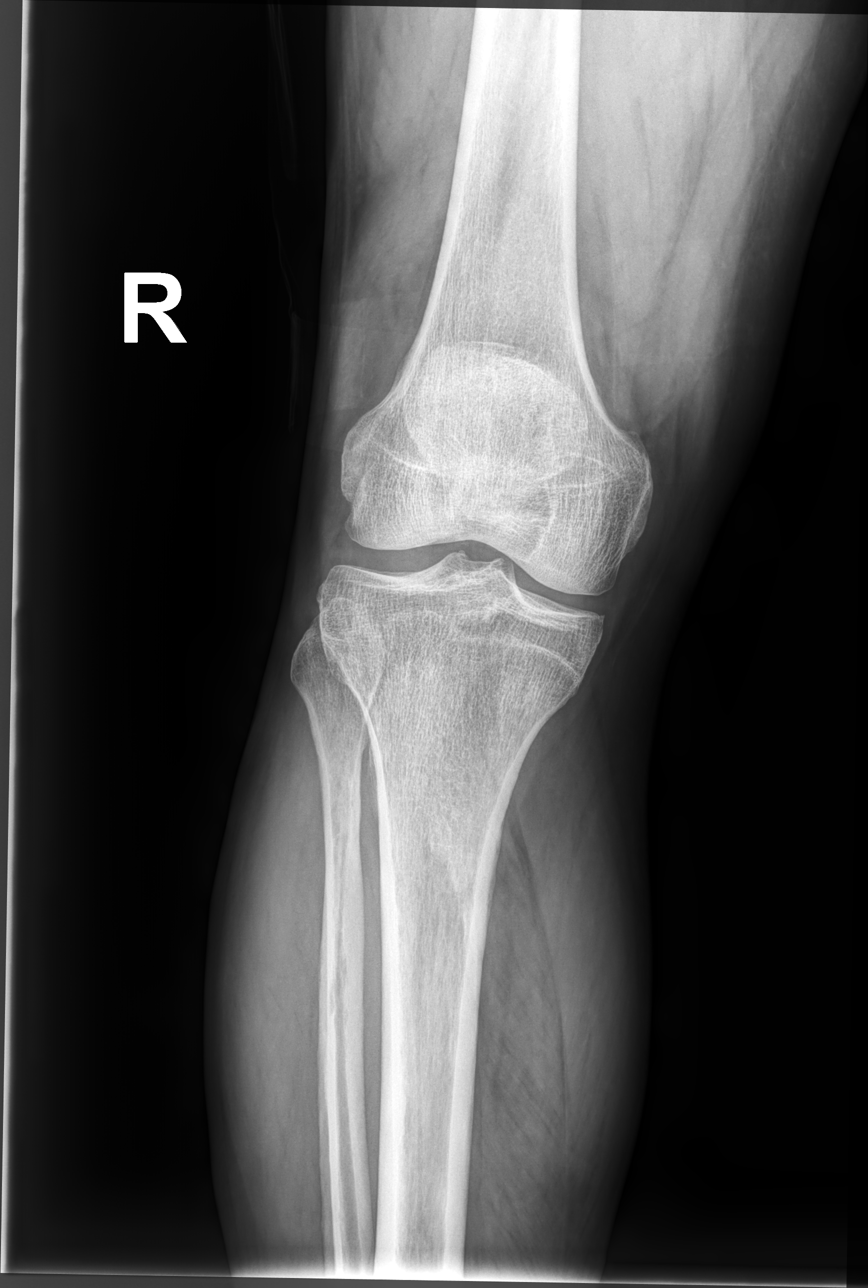

[knee mlo (1 of 2)]
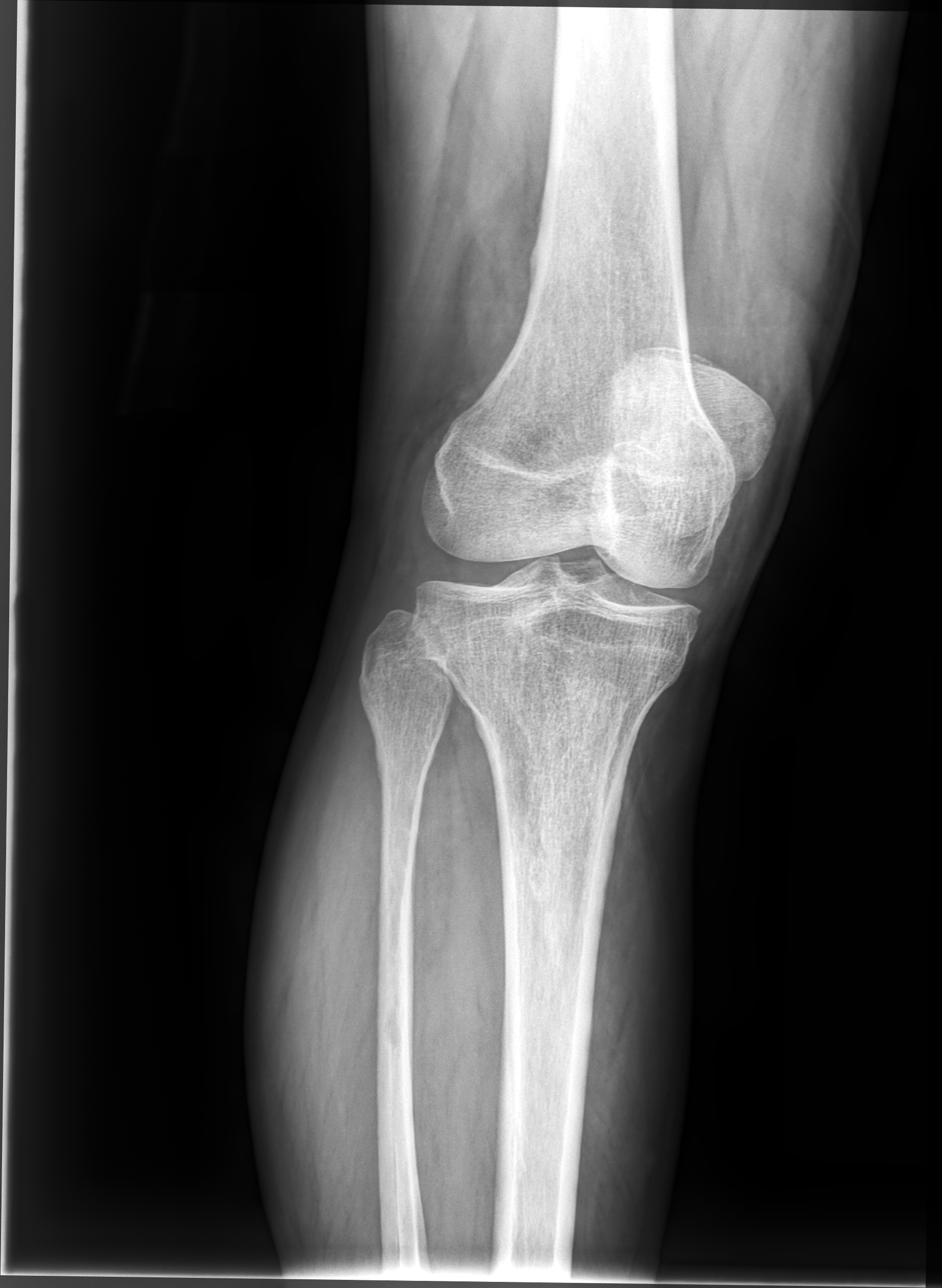

[knee mlo (2 of 2)]
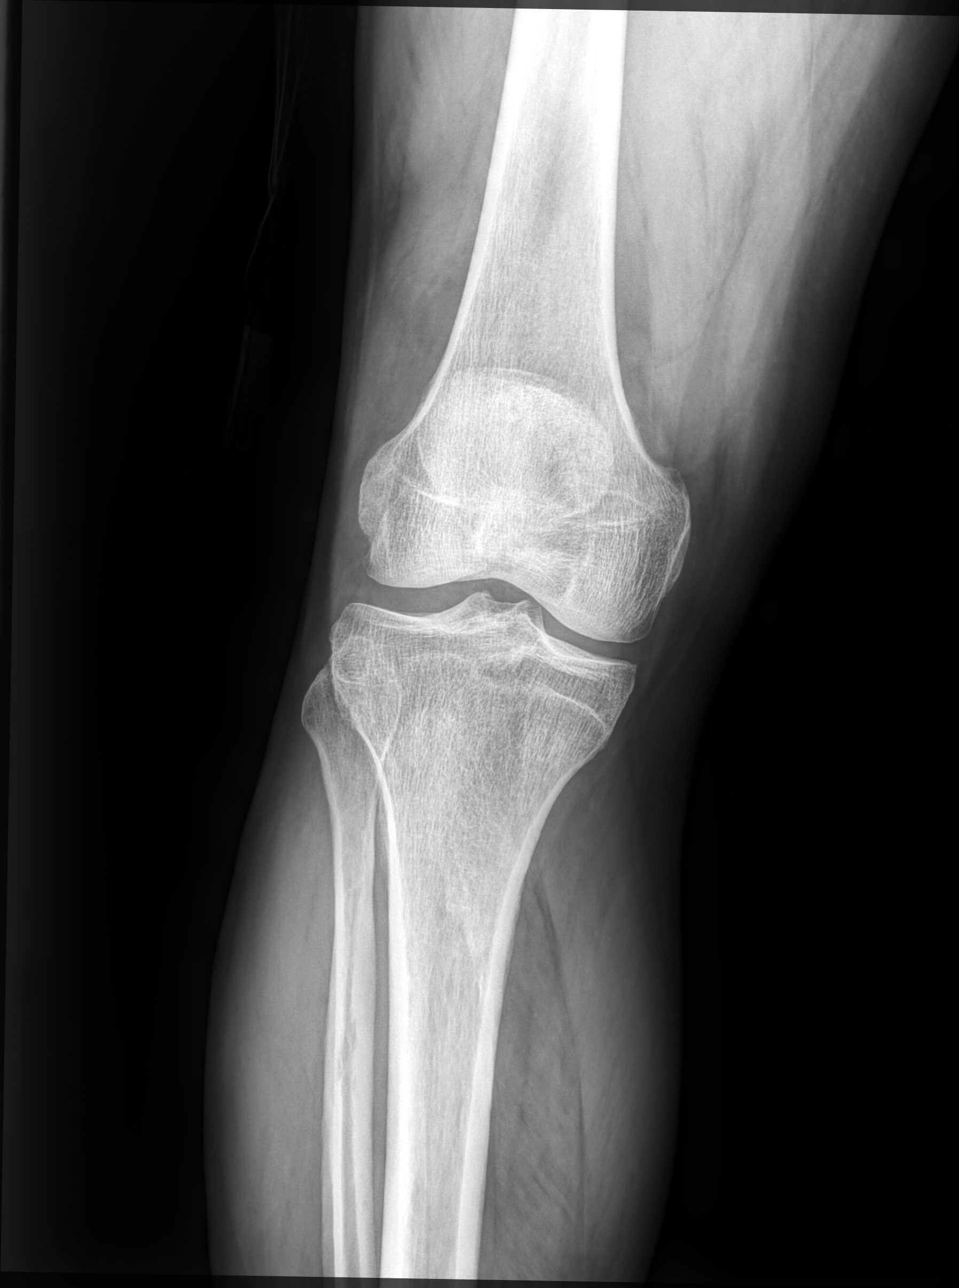

[knee lat]
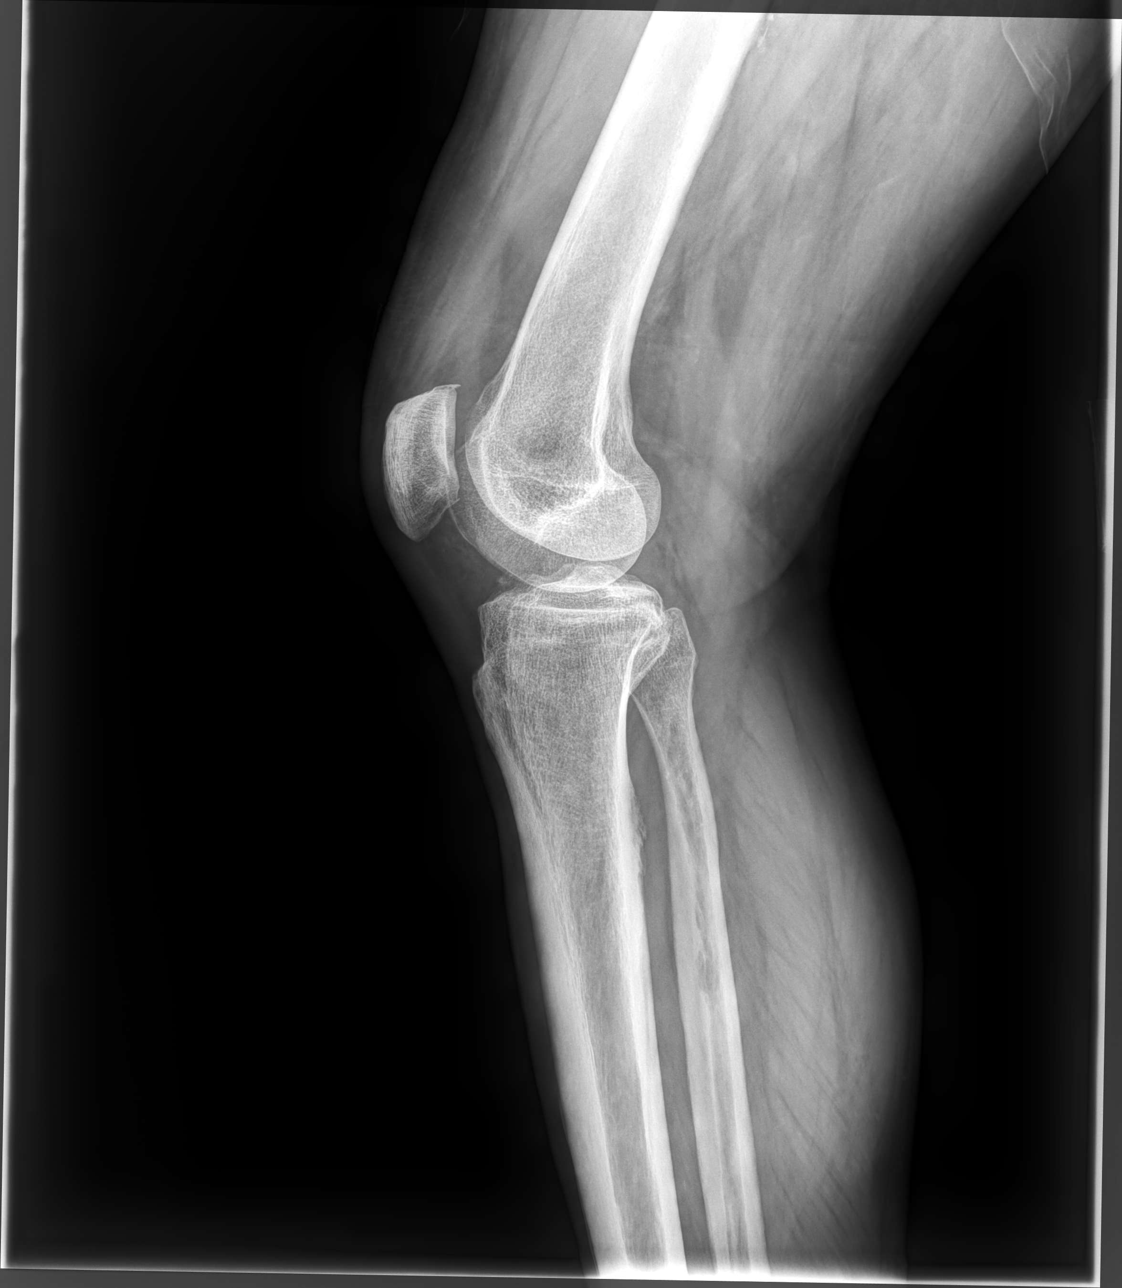

[4 of 4 positions shown; findings below may reference images not displayed]

FINDINGS: No recent fracture or dislocation is seen in the right knee. In the
lateral view, there is a faint 3 mm calcific density in the anterior
aspect of the knee which could not be seen in the rest of the
images. There is soft tissue fullness in the suprapatellar bursa.
Small bony spurs are noted in the medial and patellofemoral
compartments.
IMPRESSION: No fracture or dislocation is seen in the right knee. Small effusion
is seen in the suprapatellar bursa. Small bony spurs are noted in
medial and patellofemoral compartments.

## 2022-09-26 ENCOUNTER — Telehealth: Payer: Self-pay | Admitting: "Endocrinology

## 2022-09-26 NOTE — Telephone Encounter (Signed)
Left VM for pt to make him aware his pt assistance is here for pickup

## 2022-09-26 NOTE — Telephone Encounter (Signed)
Pt picked up his pt assistance. ?

## 2022-12-05 LAB — BASIC METABOLIC PANEL
BUN: 13 (ref 4–21)
Creatinine: 1 (ref 0.6–1.3)
Glucose: 182

## 2022-12-05 LAB — COMPREHENSIVE METABOLIC PANEL
PSA, Total: 2.84
eGFR: 82

## 2023-01-04 ENCOUNTER — Encounter: Payer: Self-pay | Admitting: Nurse Practitioner

## 2023-01-04 ENCOUNTER — Ambulatory Visit: Payer: Medicare Other | Admitting: Nurse Practitioner

## 2023-01-04 VITALS — BP 135/74 | HR 69 | Ht 70.0 in | Wt 218.4 lb

## 2023-01-04 DIAGNOSIS — I10 Essential (primary) hypertension: Secondary | ICD-10-CM

## 2023-01-04 DIAGNOSIS — Z7985 Long-term (current) use of injectable non-insulin antidiabetic drugs: Secondary | ICD-10-CM

## 2023-01-04 DIAGNOSIS — E1165 Type 2 diabetes mellitus with hyperglycemia: Secondary | ICD-10-CM

## 2023-01-04 DIAGNOSIS — E559 Vitamin D deficiency, unspecified: Secondary | ICD-10-CM

## 2023-01-04 DIAGNOSIS — E782 Mixed hyperlipidemia: Secondary | ICD-10-CM

## 2023-01-04 DIAGNOSIS — Z7984 Long term (current) use of oral hypoglycemic drugs: Secondary | ICD-10-CM | POA: Diagnosis not present

## 2023-01-04 DIAGNOSIS — Z794 Long term (current) use of insulin: Secondary | ICD-10-CM | POA: Diagnosis not present

## 2023-01-04 LAB — POCT GLYCOSYLATED HEMOGLOBIN (HGB A1C): Hemoglobin A1C: 9.8 % — AB (ref 4.0–5.6)

## 2023-01-04 NOTE — Progress Notes (Signed)
01/04/2023, 4:12 PM       Endocrinology follow-up note    Subjective:    Patient ID: Todd Carey, male    DOB: 10-26-1955.   Todd Carey is being seen in follow-up for management of currently uncontrolled symptomatic type 2 diabetes, hyperlipidemia, hypertension. PMD:  Todd Staggers, MD.   Past Medical History:  Diagnosis Date   Arthritis    Right shoulder    Diabetes mellitus    Hypercholesteremia    Past Surgical History:  Procedure Laterality Date   COLONOSCOPY     COLONOSCOPY N/A 02/08/2016   Procedure: COLONOSCOPY;  Surgeon: West Bali, MD;  Location: [facility];  Service: Endoscopy;  Laterality: N/A;  10:00 Am   COLONOSCOPY WITH PROPOFOL N/A 09/27/2021   Procedure: COLONOSCOPY WITH PROPOFOL;  Surgeon: Lanelle Bal, DO;  Location: [facility];  Service: Endoscopy;  Laterality: N/A;  12:15pm.   POLYPECTOMY  09/27/2021   Procedure: POLYPECTOMY;  Surgeon: Lanelle Bal, DO;  Location: [facility];  Service: Endoscopy;;   Social History   Socioeconomic History   Marital status: Married    Spouse name: Not on file   Number of children: Not on file   Years of education: Not on file   Highest education level: Not on file  Occupational History   Not on file  Tobacco Use   Smoking status: Former    Current packs/day: 0.00    Average packs/day: 1 pack/day for 38.0 years (38.0 ttl pk-yrs)    Types: Cigarettes    Start date: 04/19/1975    Quit date: 04/18/2013    Years since quitting: 9.7   Smokeless tobacco: Former  Building services engineer status: Never Used  Substance and Sexual Activity   Alcohol use: No   Drug use: No   Sexual activity: Not on file  Other Topics Concern   Not on file  Social History Narrative   Not on file   Social Determinants of Health   Financial Resource Strain: Not on file  Food Insecurity: Not on file  Transportation Needs: Not on file  Physical Activity: Not on file  Stress: Not  on file  Social Connections: Not on file   Outpatient Encounter Medications as of 01/04/2023  Medication Sig   amLODipine (NORVASC) 5 MG tablet Take 5 mg by mouth daily.   aspirin 81 MG tablet Take 81 mg by mouth daily.   atorvastatin (LIPITOR) 40 MG tablet Take 40 mg by mouth daily.   Cholecalciferol (VITAMIN D) 2000 units tablet Take 2,000 Units by mouth daily.   Continuous Blood Gluc Receiver (FREESTYLE LIBRE 2 READER) DEVI USE TO CHECK GLUCOSE AS DIRECTED   Continuous Glucose Sensor (FREESTYLE LIBRE 2 SENSOR) MISC USE TO CHECK GLUCOSE FOUR TIMES DAILY AS DIRECTED. CHANGE SENSOR EVERY 14 DAYS.   fish oil-omega-3 fatty acids 1000 MG capsule Take 3 g by mouth daily.   Insulin Aspart (NOVOLOG FLEXPEN [place]) Inject 10-16 Units into the skin 3 (three) times daily.   insulin degludec (TRESIBA FLEXTOUCH) 200 UNIT/ML FlexTouch Pen Inject 70 Units into the skin at bedtime.   Insulin Pen Needle (PEN NEEDLES) 31G X 8 MM MISC Use to inject insulin 4 times daily   losartan (COZAAR) 100 MG tablet Take 100 mg by mouth daily.   metFORMIN (GLUCOPHAGE) 1000 MG tablet Take 1 tablet (1,000 mg total) by mouth 2 (two) times daily with a meal.   Semaglutide,  2 MG/DOSE, (OZEMPIC, 2 MG/DOSE,) 8 MG/3ML SOPN Inject 2 mg into the skin every Saturday.   insulin glargine, 2 Unit Dial, (TOUJEO MAX SOLOSTAR) 300 UNIT/ML Solostar Pen Inject 80 Units into the skin at bedtime. (Patient not taking: Reported on 01/04/2023)   [DISCONTINUED] insulin aspart (FIASP FLEXTOUCH) 100 UNIT/ML FlexTouch Pen Inject 25-31 Units into the skin 3 (three) times daily with meals. (Patient not taking: Reported on 04/27/2022)   [DISCONTINUED] insulin lispro (HUMALOG KWIKPEN) 200 UNIT/ML KwikPen Inject 15-21 Units into the skin 3 (three) times daily. (Patient not taking: Reported on 01/04/2023)   No facility-administered encounter medications on file as of 01/04/2023.    ALLERGIES: No Known Allergies  VACCINATION STATUS: Immunization History   Administered Date(s) Administered   Influenza,inj,Quad PF,6+ Mos 09/05/2018   Moderna Covid-19 Vaccine Bivalent Booster 17yrs & up 11/09/2020   Moderna Sars-Covid-2 Vaccination 04/16/2019, 12/02/2019, 05/25/2020   Zoster Recombinant(Shingrix) 06/20/2018, 08/24/2018    Diabetes He presents for his follow-up diabetic visit. He has type 2 diabetes mellitus. Onset time: He was diagnosed at approximate age of 61 years. His disease course has been worsening. There are no hypoglycemic associated symptoms. Pertinent negatives for hypoglycemia include no confusion, headaches, pallor or seizures. Associated symptoms include fatigue. Pertinent negatives for diabetes include no blurred vision, no chest pain, no polydipsia, no polyphagia, no polyuria and no weakness. There are no hypoglycemic complications. Symptoms are stable. There are no diabetic complications. Risk factors for coronary artery disease include diabetes mellitus, dyslipidemia, family history, male sex, hypertension, obesity, sedentary lifestyle and tobacco exposure. Current diabetic treatment includes intensive insulin program and oral agent (monotherapy) (an Ozempic). He is compliant with treatment most of the time. His weight is stable. He is following a generally healthy diet. Meal planning includes avoidance of concentrated sweets. He has not had a previous visit with a dietitian. He participates in exercise daily (walks at the mall daily). His home blood glucose trend is increasing steadily. His breakfast blood glucose range is generally >200 mg/dl. His lunch blood glucose range is generally >200 mg/dl. His dinner blood glucose range is generally >200 mg/dl. His bedtime blood glucose range is generally >200 mg/dl. His overall blood glucose range is >200 mg/dl. (He presents today, accompanied by his wife, with his CGM showing above target glycemic profile overall.  His POCT A1c today is 9.8%, increasing from last visit of 7.9%.  Analysis of his  CGM shows TIR 0%, TAR 100% (most in level 2 hyperglycemia), TBR 0% with a GMI of 10.1%.  He has been out of his Ozempic for the last 2.5 weeks, waiting on shipment from [facility].  ) An ACE inhibitor/angiotensin II receptor blocker is being taken. He does not see a podiatrist.Eye exam is current.  Hyperlipidemia This is a chronic problem. The current episode started more than 1 year ago. The problem is controlled. Recent lipid tests were reviewed and are normal. Exacerbating diseases include chronic renal disease, diabetes and obesity. Factors aggravating his hyperlipidemia include fatty foods. Pertinent negatives include no chest pain, myalgias or shortness of breath. Current antihyperlipidemic treatment includes statins. The current treatment provides moderate improvement of lipids. There are no compliance problems.  Risk factors for coronary artery disease include dyslipidemia, diabetes mellitus, family history, male sex, obesity, hypertension and a sedentary lifestyle.  Hypertension This is a chronic problem. The current episode started more than 1 year ago. The problem has been gradually improving since onset. The problem is controlled. Pertinent negatives include no blurred vision, chest pain, headaches,  neck pain, palpitations or shortness of breath. There are no associated agents to hypertension. Risk factors for coronary artery disease include dyslipidemia, diabetes mellitus, obesity, male gender, smoking/tobacco exposure and sedentary lifestyle. Past treatments include angiotensin blockers and calcium channel blockers. The current treatment provides moderate improvement. There are no compliance problems.  Hypertensive end-organ damage includes kidney disease. Identifiable causes of hypertension include chronic renal disease.   Review of systems  Constitutional: + stable body weight,  current Body mass index is 31.34 kg/m. , no fatigue, no subjective hyperthermia, no subjective hypothermia Eyes: no  blurry vision, no xerophthalmia ENT: no sore throat, no nodules palpated in throat, no dysphagia/odynophagia, no hoarseness Cardiovascular: no chest pain, no shortness of breath, no palpitations, no leg swelling Respiratory: no cough, no shortness of breath Gastrointestinal: no nausea/vomiting/diarrhea Musculoskeletal: no muscle/joint aches Skin: no rashes, no hyperemia Neurological: no tremors, no numbness, no tingling, no dizziness Psychiatric: no depression, no anxiety   Objective:    BP 135/74 (BP Location: Right Arm, Patient Position: Sitting, Cuff Size: Large)   Pulse 69   Ht 5\' 10"  (1.778 m)   Wt 218 lb 6.4 oz (99.1 kg)   BMI 31.34 kg/m   Wt Readings from Last 3 Encounters:  01/04/23 218 lb 6.4 oz (99.1 kg)  08/31/22 222 lb 12.8 oz (101.1 kg)  04/27/22 222 lb 9.6 oz (101 kg)    BP Readings from Last 3 Encounters:  01/04/23 135/74  08/31/22 120/69  04/27/22 (!) 143/75     Physical Exam- Limited  Constitutional:  Body mass index is 31.34 kg/m. , not in acute distress, normal state of mind Eyes:  EOMI, no exophthalmos Musculoskeletal: no gross deformities, strength intact in all four extremities, no gross restriction of joint movements Skin:  no rashes, no hyperemia Neurological: no tremor with outstretched hands  Diabetic Foot Exam - Simple   No data filed     Recent Results (from the past 2160 hour(s))  HgB A1c     Status: Abnormal   Collection Time: 01/04/23  1:51 PM  Result Value Ref Range   Hemoglobin A1C 9.8 (A) 4.0 - 5.6 %   HbA1c POC (<> result, manual entry)     HbA1c, POC (prediabetic range)     HbA1c, POC (controlled diabetic range)        Recent Results (from the past 2160 hour(s))  HgB A1c     Status: Abnormal   Collection Time: 01/04/23  1:51 PM  Result Value Ref Range   Hemoglobin A1C 9.8 (A) 4.0 - 5.6 %   HbA1c POC (<> result, manual entry)     HbA1c, POC (prediabetic range)     HbA1c, POC (controlled diabetic range)          Assessment & Plan:   1) Uncontrolled type 2 diabetes mellitus with hyperglycemia (HCC)  - Todd Carey has currently uncontrolled symptomatic type 2 DM since 67 years of age.  He presents today, accompanied by his wife, with his CGM showing above target glycemic profile overall.  His POCT A1c today is 9.8%, increasing from last visit of 7.9%.  Analysis of his CGM shows TIR 0%, TAR 100% (most in level 2 hyperglycemia), TBR 0% with a GMI of 10.1%.  He has been out of his Ozempic for the last 2.5 weeks, waiting on shipment from [facility].    - Recent labs reviewed.  -his diabetes is complicated by obesity/sedentary life, and Todd Carey remains at a high risk for more acute  and chronic complications which include CAD, CVA, CKD, retinopathy, and neuropathy. These are all discussed in detail with the patient.  - Nutritional counseling repeated at each appointment due to patients tendency to fall back in to old habits.  - The patient admits there is a room for improvement in their diet and drink choices. -  Suggestion is made for the patient to avoid simple carbohydrates from their diet including Cakes, Sweet Desserts / Pastries, Ice Cream, Soda (diet and regular), Sweet Tea, Candies, Chips, Cookies, Sweet Pastries, Store Bought Juices, Alcohol in Excess of 1-2 drinks a day, Artificial Sweeteners, Coffee Creamer, and "Sugar-free" Products. This will help patient to have stable blood glucose profile and potentially avoid unintended weight gain.   - I encouraged the patient to switch to unprocessed or minimally processed complex starch and increased protein intake (animal or plant source), fruits, and vegetables.   - Patient is advised to stick to a routine mealtimes to eat 3 meals a day and avoid unnecessary snacks (to snack only to correct hypoglycemia).  -He follows with a dietitian in [place].  - I have approached him with the following individualized plan to manage diabetes and  patient agrees:   -He will continue to require intensive treatment with basal/bolus insulin in order for him to maintain control of diabetes to target.  I did send in for longer pen needles today.  -He is advised to increase his [place] to 80 units SQ daily and continue his Novolog 10-16 units (will change to Novolog and send to [facility]) TID with meals if glucose is above 90 and he is eating (Specific instructions on how to titrate insulin dosage based on glucose readings given to patient in writing).  He is also advised to continue his Ozempic to 2 mg SQ weekly and Metformin 1000 mg po twice daily with meals.  I did give him sample Ozempic 1 mg pen to help hold him over until his P[facility] shipment comes in.  -He is encouraged to continue using CGM to monitor blood glucose at least 4 times per day, before meals and at bedtime, and call the clinic for readings less than 70 or greater than 200 for 3 tests in a row.  - Patient specific target  A1c;  LDL, HDL, Triglycerides, and  Waist Circumference were discussed in detail.  2) BP/HTN:  His blood pressure is controlled to target.  He is advised to continue Losartan 100 mg po daily and Amlodipine 5 mg po daily.  3) Lipids/HPL:  His most recent lipid panel from 04/25/22 shows controlled LDL at 23 and Triglycerides of 121.  He is advised to continue Atorvastatin 40 mg po daily at bedtime, and Fish Oil 3g po daily.   4)  Weight/Diet:  His Body mass index is 31.34 kg/m.-clearly complicating the care of his diabetes.  He is a candidate for modest weight loss.  CDE Consult will be initiated , exercise, and detailed carbohydrates information provided.  5) Chronic Care/Health Maintenance: -he is on ACEI/ARB and Statin medications and is encouraged to continue to follow up with Ophthalmology, Dentist,  Podiatrist at least yearly or according to recommendations, and advised to  stay away from smoking. I have recommended yearly flu vaccine and pneumonia vaccination at  least every 5 years; moderate intensity exercise for up to 150 minutes weekly; and  sleep for at least 7 hours a day.  - I advised patient to maintain close follow up with Todd Staggers, MD for primary care needs.  I spent  36  minutes in the care of the patient today including review of labs from CMP, Lipids, Thyroid Function, Hematology (current and previous including abstractions from other facilities); face-to-face time discussing  his blood glucose readings/logs, discussing hypoglycemia and hyperglycemia episodes and symptoms, medications doses, his options of short and long term treatment based on the latest standards of care / guidelines;  discussion about incorporating lifestyle medicine;  and documenting the encounter. Risk reduction counseling performed per USPSTF guidelines to reduce obesity and cardiovascular risk factors.     Please refer to Patient Instructions for Blood Glucose Monitoring and Insulin/Medications Dosing Guide"  in media tab for additional information. Please  also refer to " Patient Self Inventory" in the Media  tab for reviewed elements of pertinent patient history.  Todd Carey participated in the discussions, expressed understanding, and voiced agreement with the above plans.  All questions were answered to his satisfaction. he is encouraged to contact clinic should he have any questions or concerns prior to his return visit.    Follow up plan: - Return in about 3 months (around 04/04/2023) for Diabetes F/U with A1c in office, No previsit labs, Bring meter and logs.  Ronny Bacon, [facility] [address] [place], [place] [address] Phone: [phone] Fax: [phone]  01/04/2023, 4:12 PM

## 2023-01-18 ENCOUNTER — Telehealth: Payer: Self-pay | Admitting: Nurse Practitioner

## 2023-01-18 NOTE — Telephone Encounter (Signed)
Pt picked up pt assistance.

## 2023-01-18 NOTE — Telephone Encounter (Signed)
Called and lvm to let pt know pt assistance was ready to be picked up

## 2023-01-23 ENCOUNTER — Other Ambulatory Visit: Payer: Self-pay | Admitting: Nurse Practitioner

## 2023-01-23 DIAGNOSIS — E1165 Type 2 diabetes mellitus with hyperglycemia: Secondary | ICD-10-CM

## 2023-02-07 NOTE — Telephone Encounter (Signed)
 Can you fix this? He is out of his [employer]

## 2023-02-12 ENCOUNTER — Telehealth: Payer: Self-pay | Admitting: Nurse Practitioner

## 2023-02-12 DIAGNOSIS — E1165 Type 2 diabetes mellitus with hyperglycemia: Secondary | ICD-10-CM

## 2023-02-12 MED ORDER — FREESTYLE LIBRE 2 SENSOR MISC: 2 refills | Status: DC

## 2023-02-12 NOTE — Telephone Encounter (Signed)
 Pts sensors were sent in on 01/25/23.  Shows transmission failed to pharmacy, can we try to send this back in, he said he's almost out.

## 2023-02-12 NOTE — Telephone Encounter (Signed)
 Rx for [employer] 2 sensors resent to pharmacy.

## 2023-02-21 ENCOUNTER — Other Ambulatory Visit: Payer: Self-pay | Admitting: Nurse Practitioner

## 2023-02-22 ENCOUNTER — Telehealth: Payer: Self-pay | Admitting: Nurse Practitioner

## 2023-02-22 NOTE — Telephone Encounter (Signed)
2025 re enrollment for for [employer] PAP mailed to pt.

## 2023-04-11 ENCOUNTER — Ambulatory Visit (INDEPENDENT_AMBULATORY_CARE_PROVIDER_SITE_OTHER): Payer: Medicare Other | Admitting: Nurse Practitioner

## 2023-04-11 ENCOUNTER — Encounter: Payer: Self-pay | Admitting: Nurse Practitioner

## 2023-04-11 VITALS — BP 130/62 | HR 72 | Ht 70.0 in | Wt 220.4 lb

## 2023-04-11 DIAGNOSIS — I10 Essential (primary) hypertension: Secondary | ICD-10-CM | POA: Diagnosis not present

## 2023-04-11 DIAGNOSIS — E1165 Type 2 diabetes mellitus with hyperglycemia: Secondary | ICD-10-CM

## 2023-04-11 DIAGNOSIS — Z794 Long term (current) use of insulin: Secondary | ICD-10-CM

## 2023-04-11 DIAGNOSIS — Z7985 Long-term (current) use of injectable non-insulin antidiabetic drugs: Secondary | ICD-10-CM

## 2023-04-11 DIAGNOSIS — E782 Mixed hyperlipidemia: Secondary | ICD-10-CM | POA: Diagnosis not present

## 2023-04-11 DIAGNOSIS — Z7984 Long term (current) use of oral hypoglycemic drugs: Secondary | ICD-10-CM

## 2023-04-11 DIAGNOSIS — E559 Vitamin D deficiency, unspecified: Secondary | ICD-10-CM

## 2023-04-11 LAB — POCT GLYCOSYLATED HEMOGLOBIN (HGB A1C): Hemoglobin A1C: 11.1 % — AB (ref 4.0–5.6)

## 2023-04-11 MED ORDER — METFORMIN HCL 1000 MG PO TABS: 1000.0000 mg | ORAL_TABLET | Freq: Two times a day (BID) | ORAL | 3 refills | Status: DC

## 2023-04-11 NOTE — Progress Notes (Signed)
 04/11/2023, 2:51 PM       Endocrinology follow-up note    Subjective:    Patient ID: Todd Carey, male    DOB: [birth year].   Todd Carey is being seen in follow-up for management of currently uncontrolled symptomatic type 2 diabetes, hyperlipidemia, hypertension. PMD:  Craig Staggers, MD.   Past Medical History:  Diagnosis Date   Arthritis    Right shoulder    Diabetes mellitus    Hypercholesteremia    Past Surgical History:  Procedure Laterality Date   COLONOSCOPY     COLONOSCOPY N/A 02/08/2016   Procedure: COLONOSCOPY;  Surgeon: West Bali, MD;  Location: [facility];  Service: Endoscopy;  Laterality: N/A;  10:00 Am   COLONOSCOPY WITH PROPOFOL N/A 09/27/2021   Procedure: COLONOSCOPY WITH PROPOFOL;  Surgeon: Lanelle Bal, DO;  Location: [facility];  Service: Endoscopy;  Laterality: N/A;  12:15pm.   POLYPECTOMY  09/27/2021   Procedure: POLYPECTOMY;  Surgeon: Lanelle Bal, DO;  Location: [facility];  Service: Endoscopy;;   Social History   Socioeconomic History   Marital status: Married    Spouse name: Not on file   Number of children: Not on file   Years of education: Not on file   Highest education level: Not on file  Occupational History   Not on file  Tobacco Use   Smoking status: Former    Current packs/day: 0.00    Average packs/day: 1 pack/day for 38.0 years (38.0 ttl pk-yrs)    Types: Cigarettes    Start date: 04/19/1975    Quit date: 04/18/2013    Years since quitting: 9.9   Smokeless tobacco: Former  Building services engineer status: Never Used  Substance and Sexual Activity   Alcohol use: No   Drug use: No   Sexual activity: Not on file  Other Topics Concern   Not on file  Social History Narrative   Not on file   Social Drivers of Health   Financial Resource Strain: Not on file  Food Insecurity: Not on file  Transportation Needs: Not on file  Physical Activity: Not on file  Stress: Not on  file  Social Connections: Not on file   Outpatient Encounter Medications as of 04/11/2023  Medication Sig   amLODipine (NORVASC) 5 MG tablet Take 5 mg by mouth daily.   aspirin 81 MG tablet Take 81 mg by mouth daily.   atorvastatin (LIPITOR) 40 MG tablet Take 40 mg by mouth daily.   Cholecalciferol (VITAMIN D) 2000 units tablet Take 2,000 Units by mouth daily.   Continuous Blood Gluc Receiver (FREESTYLE LIBRE 2 READER) DEVI USE TO CHECK GLUCOSE AS DIRECTED   Continuous Glucose Sensor (FREESTYLE LIBRE 2 SENSOR) MISC USE TO CHECK GLUCOSE FOUR TIMES DAILY AS DIRECTED. CHANGE SENSOR EVERY 14 DAYS.   fish oil-omega-3 fatty acids 1000 MG capsule Take 3 g by mouth daily.   Insulin Aspart (NOVOLOG FLEXPEN Minerva) Inject 10-16 Units into the skin 3 (three) times daily.   insulin degludec (TRESIBA FLEXTOUCH) 200 UNIT/ML FlexTouch Pen Inject 70 Units into the skin at bedtime.   insulin glargine, 2 Unit Dial, (TOUJEO MAX SOLOSTAR) 300 UNIT/ML Solostar Pen Inject 80 Units into the skin at bedtime.   Insulin Pen Needle (PEN NEEDLES) 31G X 8 MM MISC Use to inject insulin 4 times daily   losartan (COZAAR) 100 MG tablet Take 100 mg by mouth daily.   Semaglutide,  2 MG/DOSE, (OZEMPIC, 2 MG/DOSE,) 8 MG/3ML SOPN Inject 2 mg into the skin every Saturday.   [DISCONTINUED] metFORMIN (GLUCOPHAGE) 1000 MG tablet TAKE 1 TABLET (1,000 MG TOTAL) BY MOUTH TWICE A DAY WITH FOOD   metFORMIN (GLUCOPHAGE) 1000 MG tablet Take 1 tablet (1,000 mg total) by mouth 2 (two) times daily with a meal.   No facility-administered encounter medications on file as of 04/11/2023.    ALLERGIES: No Known Allergies  VACCINATION STATUS: Immunization History  Administered Date(s) Administered   Influenza,inj,Quad PF,6+ Mos 09/05/2018   Moderna Covid-19 Vaccine Bivalent Booster 16yrs & up 11/09/2020   Moderna Sars-Covid-2 Vaccination 04/16/2019, 12/02/2019, 05/25/2020   Zoster Recombinant(Shingrix) 06/20/2018, 08/24/2018    Diabetes He  presents for his follow-up diabetic visit. He has type 2 diabetes mellitus. Onset time: He was diagnosed at approximate age of 29 years. His disease course has been worsening. There are no hypoglycemic associated symptoms. Pertinent negatives for hypoglycemia include no confusion, headaches, pallor or seizures. Associated symptoms include fatigue. Pertinent negatives for diabetes include no blurred vision, no chest pain, no polydipsia, no polyphagia, no polyuria and no weakness. There are no hypoglycemic complications. Symptoms are stable. There are no diabetic complications. Risk factors for coronary artery disease include diabetes mellitus, dyslipidemia, family history, male sex, hypertension, obesity, sedentary lifestyle and tobacco exposure. Current diabetic treatment includes intensive insulin program and oral agent (monotherapy) (an Ozempic). He is compliant with treatment most of the time. His weight is stable. He is following a generally healthy diet. Meal planning includes avoidance of concentrated sweets. He has not had a previous visit with a dietitian. He participates in exercise daily (walks at the mall daily). His home blood glucose trend is increasing steadily. His breakfast blood glucose range is generally >200 mg/dl. His lunch blood glucose range is generally >200 mg/dl. His dinner blood glucose range is generally >200 mg/dl. His bedtime blood glucose range is generally >200 mg/dl. His overall blood glucose range is >200 mg/dl. (He presents today, accompanied by his wife, with his CGM showing above target glycemic profile overall.  His POCT A1c today is 11.1%, increasing from last visit of 9.8%.  Analysis of his CGM shows TIR 2%, TAR 98% (85% in level 2 hyperglycemia), TBR 0% with a GMI of 11.3%.  He has been out of his Ozempic since December, has been waiting on a shipment from patient assistance.  He did get notification from [employer] that he was approved for the program for this year.  In the  interim, he did increase his [place] to 100 units nightly.) An ACE inhibitor/angiotensin II receptor blocker is being taken. He does not see a podiatrist.Eye exam is current.  Hyperlipidemia This is a chronic problem. The current episode started more than 1 year ago. The problem is controlled. Recent lipid tests were reviewed and are normal. Exacerbating diseases include chronic renal disease, diabetes and obesity. Factors aggravating his hyperlipidemia include fatty foods. Pertinent negatives include no chest pain, myalgias or shortness of breath. Current antihyperlipidemic treatment includes statins. The current treatment provides moderate improvement of lipids. There are no compliance problems.  Risk factors for coronary artery disease include dyslipidemia, diabetes mellitus, family history, male sex, obesity, hypertension and a sedentary lifestyle.  Hypertension This is a chronic problem. The current episode started more than 1 year ago. The problem has been gradually improving since onset. The problem is controlled. Pertinent negatives include no blurred vision, chest pain, headaches, neck pain, palpitations or shortness of breath. There are no  associated agents to hypertension. Risk factors for coronary artery disease include dyslipidemia, diabetes mellitus, obesity, male gender, smoking/tobacco exposure and sedentary lifestyle. Past treatments include angiotensin blockers and calcium channel blockers. The current treatment provides moderate improvement. There are no compliance problems.  Hypertensive end-organ damage includes kidney disease. Identifiable causes of hypertension include chronic renal disease.   Review of systems  Constitutional: +increasing body weight,  current Body mass index is 31.62 kg/m. , no fatigue, no subjective hyperthermia, no subjective hypothermia Eyes: no blurry vision, no xerophthalmia ENT: no sore throat, no nodules palpated in throat, no dysphagia/odynophagia, no  hoarseness Cardiovascular: no chest pain, no shortness of breath, no palpitations, no leg swelling Respiratory: no cough, no shortness of breath Gastrointestinal: no nausea/vomiting/diarrhea Musculoskeletal: no muscle/joint aches Skin: no rashes, no hyperemia Neurological: no tremors, no numbness, no tingling, no dizziness Psychiatric: no depression, no anxiety   Objective:    BP 130/62 (BP Location: Left Arm, Patient Position: Sitting, Cuff Size: Large)   Pulse 72   Ht 5\' 10"  (1.778 m)   Wt 220 lb 6.4 oz (100 kg)   BMI 31.62 kg/m   Wt Readings from Last 3 Encounters:  04/11/23 220 lb 6.4 oz (100 kg)  01/04/23 218 lb 6.4 oz (99.1 kg)  08/31/22 222 lb 12.8 oz (101.1 kg)    BP Readings from Last 3 Encounters:  04/11/23 130/62  01/04/23 135/74  08/31/22 120/69     Physical Exam- Limited  Constitutional:  Body mass index is 31.62 kg/m. , not in acute distress, normal state of mind Eyes:  EOMI, no exophthalmos Musculoskeletal: no gross deformities, strength intact in all four extremities, no gross restriction of joint movements Skin:  no rashes, no hyperemia Neurological: no tremor with outstretched hands   Diabetic Foot Exam - Simple   No data filed     Recent Results (from the past 2160 hours)  HgB A1c     Status: Abnormal   Collection Time: 04/11/23  1:52 PM  Result Value Ref Range   Hemoglobin A1C 11.1 (A) 4.0 - 5.6 %   HbA1c POC (<> result, manual entry)     HbA1c, POC (prediabetic range)     HbA1c, POC (controlled diabetic range)         Recent Results (from the past 2160 hours)  HgB A1c     Status: Abnormal   Collection Time: 04/11/23  1:52 PM  Result Value Ref Range   Hemoglobin A1C 11.1 (A) 4.0 - 5.6 %   HbA1c POC (<> result, manual entry)     HbA1c, POC (prediabetic range)     HbA1c, POC (controlled diabetic range)          Assessment & Plan:   1) Uncontrolled type 2 diabetes mellitus with hyperglycemia (HCC)  - Jaevion R Mashek has  currently uncontrolled symptomatic type 2 DM since 68 years of age.  He presents today, accompanied by his wife, with his CGM showing above target glycemic profile overall.  His POCT A1c today is 11.1%, increasing from last visit of 9.8%.  Analysis of his CGM shows TIR 2%, TAR 98% (85% in level 2 hyperglycemia), TBR 0% with a GMI of 11.3%.  He has been out of his Ozempic since December, has been waiting on a shipment from patient assistance.  He did get notification from [employer] that he was approved for the program for this year.  In the interim, he did increase his [place] to 100 units nightly.  - Recent labs  reviewed.  -his diabetes is complicated by obesity/sedentary life, and NICHOLS CORTER remains at a high risk for more acute and chronic complications which include CAD, CVA, CKD, retinopathy, and neuropathy. These are all discussed in detail with the patient.  - Nutritional counseling repeated at each appointment due to patients tendency to fall back in to old habits.  - The patient admits there is a room for improvement in their diet and drink choices. -  Suggestion is made for the patient to avoid simple carbohydrates from their diet including Cakes, Sweet Desserts / Pastries, Ice Cream, Soda (diet and regular), Sweet Tea, Candies, Chips, Cookies, Sweet Pastries, Store Bought Juices, Alcohol in Excess of 1-2 drinks a day, Artificial Sweeteners, Coffee Creamer, and "Sugar-free" Products. This will help patient to have stable blood glucose profile and potentially avoid unintended weight gain.   - I encouraged the patient to switch to unprocessed or minimally processed complex starch and increased protein intake (animal or plant source), fruits, and vegetables.   - Patient is advised to stick to a routine mealtimes to eat 3 meals a day and avoid unnecessary snacks (to snack only to correct hypoglycemia).  -He follows with a dietitian in [place].  - I have approached him with the  following individualized plan to manage diabetes and patient agrees:   -He will continue to require intensive treatment with basal/bolus insulin in order for him to maintain control of diabetes to target.  I did send in for longer pen needles today.  -He is advised to increase his [place] to 100 units SQ daily and increase his Novolog 18-24 units (will send in dosage change to PAP) TID with meals if glucose is above 90 and he is eating (Specific instructions on how to titrate insulin dosage based on glucose readings given to patient in writing).  He is advised to continue  Metformin 1000 mg po twice daily with meals.  I did give him sample Ozempic 0.5 mg pen to help hold him over until his PAP shipment comes in.  He is getting the 2 mg dose from PAP.  I encouraged him to taper as follows: Ozempic 0.25 mg SQ weekly x 2, then increase to 0.5 mg SQ weekly x 1 month, then increase to 1 mg SQ weekly x 1 month (or 37 clicks on the 2 mg pen), then increase to the 2 mg again.  -He is encouraged to continue using CGM to monitor blood glucose at least 4 times per day, before meals and at bedtime, and call the clinic for readings less than 70 or greater than 200 for 3 tests in a row.  - Patient specific target  A1c;  LDL, HDL, Triglycerides, and  Waist Circumference were discussed in detail.  2) BP/HTN:  His blood pressure is controlled to target.  He is advised to continue Losartan 100 mg po daily and Amlodipine 5 mg po daily.  3) Lipids/HPL:  His most recent lipid panel from 04/25/22 shows controlled LDL at 23 and Triglycerides of 121.  He is advised to continue Atorvastatin 40 mg po daily at bedtime, and Fish Oil 3g po daily.   4)  Weight/Diet:  His Body mass index is 31.62 kg/m.-clearly complicating the care of his diabetes.  He is a candidate for modest weight loss.  CDE Consult will be initiated , exercise, and detailed carbohydrates information provided.  5) Chronic Care/Health Maintenance: -he is on  ACEI/ARB and Statin medications and is encouraged to continue to follow up  with Ophthalmology, Dentist,  Podiatrist at least yearly or according to recommendations, and advised to  stay away from smoking. I have recommended yearly flu vaccine and pneumonia vaccination at least every 5 years; moderate intensity exercise for up to 150 minutes weekly; and  sleep for at least 7 hours a day.  - I advised patient to maintain close follow up with Craig Staggers, MD for primary care needs.     I spent  48  minutes in the care of the patient today including review of labs from CMP, Lipids, Thyroid Function, Hematology (current and previous including abstractions from other facilities); face-to-face time discussing  his blood glucose readings/logs, discussing hypoglycemia and hyperglycemia episodes and symptoms, medications doses, his options of short and long term treatment based on the latest standards of care / guidelines;  discussion about incorporating lifestyle medicine;  and documenting the encounter. Risk reduction counseling performed per USPSTF guidelines to reduce obesity and cardiovascular risk factors.     Please refer to Patient Instructions for Blood Glucose Monitoring and Insulin/Medications Dosing Guide"  in media tab for additional information. Please  also refer to " Patient Self Inventory" in the Media  tab for reviewed elements of pertinent patient history.  Beryle Lathe participated in the discussions, expressed understanding, and voiced agreement with the above plans.  All questions were answered to his satisfaction. he is encouraged to contact clinic should he have any questions or concerns prior to his return visit.    Follow up plan: - Return in about 3 months (around 07/12/2023) for Diabetes F/U with A1c in office, No previsit labs, Bring meter and logs.  Ronny Bacon, [facility] [address] [place], [place] [address] Phone:  [phone] Fax: [phone]  04/11/2023, 2:51 PM

## 2023-04-30 ENCOUNTER — Other Ambulatory Visit: Payer: Self-pay | Admitting: Nurse Practitioner

## 2023-04-30 DIAGNOSIS — E1165 Type 2 diabetes mellitus with hyperglycemia: Secondary | ICD-10-CM

## 2023-05-01 ENCOUNTER — Telehealth: Payer: Self-pay | Admitting: Nurse Practitioner

## 2023-05-01 NOTE — Telephone Encounter (Signed)
 Pt picked up pt assistance.

## 2023-05-01 NOTE — Telephone Encounter (Signed)
 Left message that Pen needles from patient assistance is ready for pick up

## 2023-05-03 ENCOUNTER — Other Ambulatory Visit: Payer: Self-pay | Admitting: Nurse Practitioner

## 2023-05-03 DIAGNOSIS — E1165 Type 2 diabetes mellitus with hyperglycemia: Secondary | ICD-10-CM

## 2023-05-03 MED ORDER — FREESTYLE LIBRE 3 READER DEVI: 1.0000 | Freq: Once | 0 refills | Status: DC

## 2023-05-03 MED ORDER — FREESTYLE LIBRE 3 READER DEVI: 1.0000 | Freq: Once | 0 refills | Status: AC

## 2023-05-03 MED ORDER — FREESTYLE LIBRE 3 PLUS SENSOR MISC: 3 refills | Status: DC

## 2023-05-04 ENCOUNTER — Telehealth: Payer: Self-pay | Admitting: Nurse Practitioner

## 2023-05-04 MED ORDER — FREESTYLE LIBRE 3 READER DEVI
1.0000 | Freq: Once | 0 refills | Status: AC
Start: 2023-05-04 — End: 2023-05-04

## 2023-05-04 NOTE — Telephone Encounter (Signed)
 Pt said the [employer] 3 were sent but he states he is on the 2 Can you change it

## 2023-05-04 NOTE — Telephone Encounter (Signed)
 done

## 2023-05-04 NOTE — Telephone Encounter (Signed)
 The 2 will be discontinued in September, so he wont be able to get that, which is why we upgraded.  I can send in the [place] 3 receiver to go with his sensors but if he is using his phone, all he will have to do is download the [place] 3 app (using same username and pswd as the libre 2).

## 2023-05-07 ENCOUNTER — Telehealth: Payer: Self-pay | Admitting: Nurse Practitioner

## 2023-05-07 ENCOUNTER — Other Ambulatory Visit: Payer: Self-pay | Admitting: *Deleted

## 2023-05-07 ENCOUNTER — Other Ambulatory Visit: Payer: Self-pay | Admitting: Nurse Practitioner

## 2023-05-07 DIAGNOSIS — Z7984 Long term (current) use of oral hypoglycemic drugs: Secondary | ICD-10-CM

## 2023-05-07 DIAGNOSIS — Z794 Long term (current) use of insulin: Secondary | ICD-10-CM

## 2023-05-07 DIAGNOSIS — E1165 Type 2 diabetes mellitus with hyperglycemia: Secondary | ICD-10-CM

## 2023-05-07 MED ORDER — FREESTYLE LIBRE 3 READER DEVI: 1.0000 | Freq: Once | 0 refills | Status: AC

## 2023-05-07 MED ORDER — FREESTYLE LIBRE 3 READER DEVI: 1.0000 | Freq: Once | 0 refills | Status: DC

## 2023-05-07 NOTE — Telephone Encounter (Signed)
 Not sure, I just resent the prescription to [facility], so hopefully they can get it ready for him.

## 2023-05-07 NOTE — Telephone Encounter (Signed)
 Pt states he got his libre 3 plus sensors but not the reader?  Any ideas on why this would be?

## 2023-05-09 ENCOUNTER — Telehealth: Payer: Self-pay | Admitting: Nurse Practitioner

## 2023-05-09 NOTE — Telephone Encounter (Signed)
 Called pt both numbers in chart, no answer. His pt assistance is here

## 2023-05-11 ENCOUNTER — Telehealth: Payer: Self-pay | Admitting: Nurse Practitioner

## 2023-05-11 NOTE — Telephone Encounter (Signed)
 Let pt know pt assistance is here

## 2023-05-15 LAB — LAB REPORT - SCANNED: EGFR (Non-African Amer.): 82

## 2023-06-05 ENCOUNTER — Other Ambulatory Visit: Payer: Self-pay | Admitting: Urology

## 2023-06-05 ENCOUNTER — Encounter: Payer: Self-pay | Admitting: Urology

## 2023-06-05 ENCOUNTER — Ambulatory Visit (INDEPENDENT_AMBULATORY_CARE_PROVIDER_SITE_OTHER): Admitting: Urology

## 2023-06-05 VITALS — BP 144/78 | HR 68

## 2023-06-05 DIAGNOSIS — J302 Other seasonal allergic rhinitis: Secondary | ICD-10-CM | POA: Insufficient documentation

## 2023-06-05 DIAGNOSIS — N401 Enlarged prostate with lower urinary tract symptoms: Secondary | ICD-10-CM | POA: Insufficient documentation

## 2023-06-05 DIAGNOSIS — R351 Nocturia: Secondary | ICD-10-CM

## 2023-06-05 DIAGNOSIS — Z87891 Personal history of nicotine dependence: Secondary | ICD-10-CM | POA: Diagnosis not present

## 2023-06-05 DIAGNOSIS — R369 Urethral discharge, unspecified: Secondary | ICD-10-CM | POA: Diagnosis not present

## 2023-06-05 DIAGNOSIS — N200 Calculus of kidney: Secondary | ICD-10-CM

## 2023-06-05 LAB — MICROSCOPIC EXAMINATION
Bacteria, UA: NONE SEEN
RBC, Urine: NONE SEEN /HPF (ref 0–2)
WBC, UA: NONE SEEN /HPF (ref 0–5)

## 2023-06-05 LAB — URINALYSIS, ROUTINE W REFLEX MICROSCOPIC
Bilirubin, UA: NEGATIVE
Ketones, UA: NEGATIVE
Leukocytes,UA: NEGATIVE
Nitrite, UA: NEGATIVE
RBC, UA: NEGATIVE
Specific Gravity, UA: 1.025 (ref 1.005–1.030)
Urobilinogen, Ur: 0.2 mg/dL (ref 0.2–1.0)
pH, UA: 6 (ref 5.0–7.5)

## 2023-06-05 LAB — BLADDER SCAN AMB NON-IMAGING: Scan Result: 37

## 2023-06-05 NOTE — Addendum Note (Signed)
 Addended by: Buddie Carina R on: 06/05/2023 11:03 AM   Modules accepted: Orders

## 2023-06-05 NOTE — Progress Notes (Signed)
 Name: Todd Carey DOB: [birth year] MRN: [id number]  History of Present Illness: Todd Carey is a 68 y.o. male who presents today as a new patient at [facility] [place]. He is accompanied by his wife [name]. GU History includes: 1. Kidney stones. Has passed stones spontaneously; denies prior stone procedures.  2. BPH with LUTS.  - He denies prior GU surgery / bladder outlet procedure(s). - 12/05/2022: Normal PSA (2.84). - He denies known family history of prostate cancer.  Today he reports chief complaint of recurrent episodes of painless bloody discharge noted in his underwear. He states this occurred at least 3 times in the past few months (in mid-December 2024, March 2025, and April 2025). He denies any visible genital skin irritation when this discharge was noted and denies any known precipitating events such as genitourinary injury or trauma. Denies any history of sexual trauma and denies any concern regarding risk for STDs.   He reports weak intermittent urinary stream, urinary urgency, nocturia x2, and occasional sensations of incomplete emptying. Denies daytime urinary frequency or straining to void. Denies dysuria or gross hematuria.  He denies recent suspected stone passage. He denies acute flank pain / abdominal pain.  He denies history of pyelonephritis.  He denies history of recent or recurrent UTI. He denies history of GU malignancy or pelvic radiation.  He reports history of smoking (quit 2015; smoked 1 ppd x38 years). He reports history of occupational exposure to potentially carcinogenic gases; he retired about 11 years ago. He reports taking anticoagulants (Aspirin 81 mg).  Per chart review: > 12/05/2022: Normal renal function (GFR 82). > 04/11/2023: Hemoglobin A1c elevated (11.1).   Medications: Current Outpatient Medications  Medication Sig Dispense Refill   amLODipine (NORVASC) 5 MG tablet Take 5 mg by mouth daily.     aspirin 81 MG tablet Take  81 mg by mouth daily.     atorvastatin  (LIPITOR) 40 MG tablet Take 40 mg by mouth daily.     Cholecalciferol (VITAMIN D ) 2000 units tablet Take 2,000 Units by mouth daily.     Continuous Glucose Sensor (FREESTYLE LIBRE 3 PLUS SENSOR) MISC Change sensor every 15 days. 6 each 3   fish oil-omega-3 fatty acids 1000 MG capsule Take 3 g by mouth daily.     Insulin  Aspart (NOVOLOG  FLEXPEN [place]) Inject 10-16 Units into the skin 3 (three) times daily.     insulin  degludec (TRESIBA  FLEXTOUCH) 200 UNIT/ML FlexTouch Pen Inject 70 Units into the skin at bedtime.     Insulin  Pen Needle (PEN NEEDLES) 31G X 8 MM MISC Use to inject insulin  4 times daily 1000 each 2   losartan (COZAAR) 100 MG tablet Take 100 mg by mouth daily.     metFORMIN  (GLUCOPHAGE ) 1000 MG tablet Take 1 tablet (1,000 mg total) by mouth 2 (two) times daily with a meal. 180 tablet 3   Semaglutide , 2 MG/DOSE, (OZEMPIC , 2 MG/DOSE,) 8 MG/3ML SOPN Inject 2 mg into the skin every Saturday. 6 mL 3   insulin  glargine, 2 Unit Dial , (TOUJEO  MAX SOLOSTAR) 300 UNIT/ML Solostar Pen Inject 80 Units into the skin at bedtime. (Patient not taking: Reported on 06/05/2023) 45 mL 2   No current facility-administered medications for this visit.    Allergies: No Known Allergies  Past Medical History:  Diagnosis Date   Arthritis    Right shoulder    Diabetes mellitus    Hypercholesteremia    Past Surgical History:  Procedure Laterality Date   COLONOSCOPY  COLONOSCOPY N/A 02/08/2016   Procedure: COLONOSCOPY;  Surgeon: Alyce Jubilee, MD;  [facility]: [facility];  Service: Endoscopy;  Laterality: N/A;  10:00 Am   COLONOSCOPY WITH PROPOFOL  N/A 09/27/2021   Procedure: COLONOSCOPY WITH PROPOFOL ;  Surgeon: Vinetta Greening, DO;  Location: [facility];  Service: Endoscopy;  Laterality: N/A;  12:15pm.   POLYPECTOMY  09/27/2021   Procedure: POLYPECTOMY;  Surgeon: Vinetta Greening, DO;  [facility]: [facility];  Service: Endoscopy;;   Family History  Problem  Relation Age of Onset   Hyperlipidemia Mother    CAD Father    Colon cancer Neg Hx    Social History   Socioeconomic History   Marital status: Married    Spouse name: Not on file   Number of children: Not on file   Years of education: Not on file   Highest education level: Not on file  Occupational History   Not on file  Tobacco Use   Smoking status: Former    Current packs/day: 0.00    Average packs/day: 1 pack/day for 38.0 years (38.0 ttl pk-yrs)    Types: Cigarettes    Start date: 04/19/1975    Quit date: 04/18/2013    Years since quitting: 10.1   Smokeless tobacco: Former  Building services engineer status: Never Used  Substance and Sexual Activity   Alcohol use: No   Drug use: No   Sexual activity: Not on file  Other Topics Concern   Not on file  Social History Narrative   Not on file   Social Drivers of Health   Financial Resource Strain: Not on file  Food Insecurity: Not on file  Transportation Needs: Not on file  Physical Activity: Not on file  Stress: Not on file  Social Connections: Not on file  Intimate Partner Violence: Not on file    SUBJECTIVE  Review of Systems Constitutional: Patient denies any unintentional weight loss or change in strength lntegumentary: Patient denies any rashes or pruritus Cardiovascular: Patient denies chest pain or syncope Respiratory: Patient denies shortness of breath Gastrointestinal: Patient denies constipation Musculoskeletal: Patient denies muscle cramps or weakness Neurologic: Patient denies convulsions or seizures Allergic/Immunologic: Patient denies recent allergic reaction(s) Hematologic/Lymphatic: Patient denies bleeding tendencies Endocrine: Patient denies heat/cold intolerance  GU: As per HPI.  OBJECTIVE Vitals:   06/05/23 1002  BP: (!) 144/78  Pulse: 68   There is no height or weight on file to calculate BMI.  Physical Examination Constitutional: No obvious distress; patient is non-toxic appearing   Cardiovascular: No visible lower extremity edema.  Respiratory: The patient does not have audible wheezing/stridor; respirations do not appear labored  Gastrointestinal: Abdomen non-distended Musculoskeletal: Normal ROM of UEs  Skin: No obvious rashes/open sores  Neurologic: CN 2-12 grossly intact Psychiatric: Answered questions appropriately with normal affect  Hematologic/Lymphatic/Immunologic: No obvious bruises or sites of spontaneous bleeding  UA: positive for 1+ protein and 3+ glucosuria; otherwise unremarkable  Urine microscopy: unremarkable  PVR: 37 ml  ASSESSMENT Bloody discharge from penis - Plan: Urinalysis, Routine w reflex microscopic, CT HEMATURIA WORKUP, Cytology, urine  Kidney stones - Plan: CT HEMATURIA WORKUP  Former smoker - Plan: Cytology, urine  Benign prostatic hyperplasia with nocturia  We discussed possible etiologies including but not limited to: vigorous exercise, sexual activity, stone, trauma, blood thinner use, urinary tract infection, kidney function, BPH, malignancy. We discussed pt's nicotine use as a risk factor for GU cancer and encouraged continued cessation.  We discussed the importance of work-up  including assessing the upper and lower GU tract with CT urogram and cystoscopy. Will also check voided cytology.   We discussed the risk for clot retention and pt was advised to increase fluid intake to thin out clots. Pt was advised to go to the ER if they become unable to urinate due to clot retention, start having symptoms of anemia (which were discussed), or any other significant concerning acute symptoms.  Pt verbalized understanding and decided to pursue this work-up. Patient agreed to follow-up afterward to discuss the results and formulate a treatment plan based on the findings. All questions were answered.   PLAN Advised the following: CT ordered. Voided cytology ordered. Return for 1st available cystoscopy with any urology MD.  Orders  Placed This Encounter  Procedures   CT HEMATURIA WORKUP    Standing Status:   Future    Expiration Date:   06/04/2024    Reason for Exam (SYMPTOM  OR DIAGNOSIS REQUIRED):   Gross hematuria    Preferred imaging location?:   [facility]   Urinalysis, Routine w reflex microscopic   It has been explained that the patient is to follow regularly with their PCP in addition to all other providers involved in their care and to follow instructions provided by these respective offices. Patient advised to contact urology clinic if any urologic-pertaining questions, concerns, new symptoms or problems arise in the interim period.  There are no Patient Instructions on file for this visit.  Electronically signed by:  Lauretta Ponto, MSN, FNP-C, CUNP 06/05/2023 10:45 AM

## 2023-06-06 LAB — BASIC METABOLIC PANEL WITH GFR
BUN/Creatinine Ratio: 13 (ref 10–24)
BUN: 16 mg/dL (ref 8–27)
CO2: 21 mmol/L (ref 20–29)
Calcium: 9.6 mg/dL (ref 8.6–10.2)
Chloride: 102 mmol/L (ref 96–106)
Creatinine, Ser: 1.2 mg/dL (ref 0.76–1.27)
Glucose: 186 mg/dL — ABNORMAL HIGH (ref 70–99)
Potassium: 4.5 mmol/L (ref 3.5–5.2)
Sodium: 139 mmol/L (ref 134–144)
eGFR: 66 mL/min/{1.73_m2} (ref >59)

## 2023-06-07 ENCOUNTER — Ambulatory Visit (HOSPITAL_COMMUNITY)
Admission: RE | Admit: 2023-06-07 | Discharge: 2023-06-07 | Disposition: A | Discharge status: Home / Self Care | Source: Ambulatory Visit | Attending: Urology | Admitting: Urology

## 2023-06-07 DIAGNOSIS — N200 Calculus of kidney: Secondary | ICD-10-CM | POA: Insufficient documentation

## 2023-06-07 DIAGNOSIS — R369 Urethral discharge, unspecified: Secondary | ICD-10-CM | POA: Diagnosis present

## 2023-06-07 LAB — CYTOLOGY, URINE

## 2023-06-07 MED ORDER — IOHEXOL 300 MG/ML  SOLN
125.0000 mL | Freq: Once | INTRAMUSCULAR | Status: AC | PRN
  Administered 2023-06-07: 125 mL via INTRAVENOUS

## 2023-07-09 NOTE — Progress Notes (Signed)
 Progress note  History of Present Illness: 68 yo male is here for cysto. Seen initially for bloody penile cd. He has h/o tobacco use.  He has undergone CT hematuria protocol. This revealed a large prostate and small Lt sided non obstructing renal stones as well as (B) simple renal cysts    Past Medical History:  Diagnosis Date   Arthritis    Right shoulder    Diabetes mellitus    Hypercholesteremia     Past Surgical History:  Procedure Laterality Date   COLONOSCOPY     COLONOSCOPY N/A 02/08/2016   Procedure: COLONOSCOPY;  Surgeon: Alyce Jubilee, MD;  [facility];  Service: Endoscopy;  Laterality: N/A;  10:00 Am   COLONOSCOPY WITH PROPOFOL  N/A 09/27/2021   Procedure: COLONOSCOPY WITH PROPOFOL ;  Surgeon: Vinetta Greening, DO;  [facility]: [facility];  Service: Endoscopy;  Laterality: N/A;  12:15pm.   POLYPECTOMY  09/27/2021   Procedure: POLYPECTOMY;  Surgeon: Vinetta Greening, DO;  [facility]: [facility];  Service: Endoscopy;;    Home Medications:  Allergies as of 07/10/2023   No Known Allergies      Medication List        Accurate as of July 09, 2023  9:54 AM. If you have any questions, ask your nurse or doctor.          amLODipine 5 MG tablet Commonly known as: NORVASC Take 5 mg by mouth daily.   aspirin 81 MG tablet Take 81 mg by mouth daily.   atorvastatin  40 MG tablet Commonly known as: LIPITOR Take 40 mg by mouth daily.   fish oil-omega-3 fatty acids 1000 MG capsule Take 3 g by mouth daily.   FreeStyle Libre 3 Plus Sensor Misc Change sensor every 15 days.   losartan 100 MG tablet Commonly known as: COZAAR Take 100 mg by mouth daily.   metFORMIN  1000 MG tablet Commonly known as: GLUCOPHAGE  Take 1 tablet (1,000 mg total) by mouth 2 (two) times daily with a meal.   NOVOLOG  FLEXPEN [place] Inject 10-16 Units into the skin 3 (three) times daily.   Ozempic  (2 MG/DOSE) 8 MG/3ML Sopn Generic drug: Semaglutide  (2 MG/DOSE) Inject 2 mg into the  skin every Saturday.   Pen Needles 31G X 8 MM Misc Use to inject insulin  4 times daily   Toujeo  Max SoloStar 300 UNIT/ML Solostar Pen Generic drug: insulin  glargine (2 Unit Dial ) Inject 80 Units into the skin at bedtime.   Tresiba  FlexTouch 200 UNIT/ML FlexTouch Pen Generic drug: insulin  degludec Inject 70 Units into the skin at bedtime.   Vitamin D  50 MCG (2000 UT) tablet Take 2,000 Units by mouth daily.        Allergies: No Known Allergies  Family History  Problem Relation Age of Onset   Hyperlipidemia Mother    CAD Father    Colon cancer Neg Hx     Social History:  reports that he quit smoking about 10 years ago. His smoking use included cigarettes. He started smoking about 48 years ago. He has a 38 pack-year smoking history. He has quit using smokeless tobacco. He reports that he does not drink alcohol and does not use drugs.  ROS: A complete review of systems was performed.  All systems are negative except for pertinent findings as noted.  Physical Exam:  Vital signs in last 24 hours: There were no vitals taken for this visit. Constitutional:  Alert and oriented, No acute distress Cardiovascular: Regular rate  Respiratory: Normal  respiratory effort GI: Abdomen is soft, nontender, nondistended, no abdominal masses. No CVAT.  Genitourinary: Normal male phallus, testes are descended bilaterally and non-tender and without masses, scrotum is normal in appearance without lesions or masses, perineum is normal on inspection. Lymphatic: No lymphadenopathy Neurologic: Grossly intact, no focal deficits Psychiatric: Normal mood and affect  I have reviewed prior pt notes  I have reviewed urinalysis results  I have independently reviewed prior imaging--CT images viewed  I have reviewed prior PSA results  I have reviewed prior urine culture  Cystoscopy Procedure Note:  Indication:   After informed consent and discussion of the procedure and its risks, Todd Carey  was positioned and prepped in the standard fashion.  Cystoscopy was performed with a flexible cystoscope.   Findings: Urethra:*** Prostate:*** Bladder neck:*** Ureteral orifices:*** Bladder:***  The patient tolerated the procedure well.    Impression/Assessment:  ***  Plan:  ***

## 2023-07-10 ENCOUNTER — Encounter: Payer: Self-pay | Admitting: Urology

## 2023-07-10 ENCOUNTER — Ambulatory Visit (INDEPENDENT_AMBULATORY_CARE_PROVIDER_SITE_OTHER): Admitting: Urology

## 2023-07-10 VITALS — BP 152/74 | HR 79

## 2023-07-10 DIAGNOSIS — R319 Hematuria, unspecified: Secondary | ICD-10-CM

## 2023-07-10 DIAGNOSIS — N281 Cyst of kidney, acquired: Secondary | ICD-10-CM

## 2023-07-10 DIAGNOSIS — R369 Urethral discharge, unspecified: Secondary | ICD-10-CM

## 2023-07-10 DIAGNOSIS — N200 Calculus of kidney: Secondary | ICD-10-CM

## 2023-07-10 LAB — MICROSCOPIC EXAMINATION: Bacteria, UA: NONE SEEN

## 2023-07-10 LAB — URINALYSIS, ROUTINE W REFLEX MICROSCOPIC
Bilirubin, UA: NEGATIVE
Leukocytes,UA: NEGATIVE
Nitrite, UA: NEGATIVE
Specific Gravity, UA: 1.03 (ref 1.005–1.030)
Urobilinogen, Ur: 0.2 mg/dL (ref 0.2–1.0)
pH, UA: 6 (ref 5.0–7.5)

## 2023-07-10 MED ORDER — CEPHALEXIN 500 MG PO CAPS
500.0000 mg | ORAL_CAPSULE | Freq: Once | ORAL | Status: AC
  Administered 2023-07-10: 500 mg via ORAL

## 2023-07-13 ENCOUNTER — Telehealth: Payer: Self-pay | Admitting: Urology

## 2023-07-13 NOTE — Telephone Encounter (Signed)
 The patient called to request a medication refill of enlarged prostate. Patient would like the medication sent to [facility]. Per patient Dr D was supposed to call in at last appt.

## 2023-07-13 NOTE — Telephone Encounter (Signed)
 Return call to patient per our conversation patient states Dr. Joie Narrow was suppose to prescribe medication for his prostate. Patient is aware a message will be sent to MD regarding medication. Patient voiced understanding.

## 2023-07-15 ENCOUNTER — Other Ambulatory Visit: Payer: Self-pay | Admitting: Urology

## 2023-07-15 DIAGNOSIS — N401 Enlarged prostate with lower urinary tract symptoms: Secondary | ICD-10-CM

## 2023-07-15 MED ORDER — FINASTERIDE 5 MG PO TABS: 5.0000 mg | ORAL_TABLET | Freq: Every day | ORAL | 11 refills | Status: AC

## 2023-07-16 NOTE — Telephone Encounter (Signed)
 Patient is made aware Dr. Joie Narrow sent in Finasteride Sent too [facility] Patient voiced understanding

## 2023-07-25 ENCOUNTER — Telehealth: Payer: Self-pay | Admitting: Nurse Practitioner

## 2023-07-25 ENCOUNTER — Encounter: Payer: Self-pay | Admitting: Nurse Practitioner

## 2023-07-25 ENCOUNTER — Ambulatory Visit (INDEPENDENT_AMBULATORY_CARE_PROVIDER_SITE_OTHER): Admitting: Nurse Practitioner

## 2023-07-25 VITALS — BP 122/62 | HR 85 | Ht 70.0 in | Wt 220.2 lb

## 2023-07-25 DIAGNOSIS — I10 Essential (primary) hypertension: Secondary | ICD-10-CM

## 2023-07-25 DIAGNOSIS — Z7984 Long term (current) use of oral hypoglycemic drugs: Secondary | ICD-10-CM

## 2023-07-25 DIAGNOSIS — Z794 Long term (current) use of insulin: Secondary | ICD-10-CM | POA: Diagnosis not present

## 2023-07-25 DIAGNOSIS — E1165 Type 2 diabetes mellitus with hyperglycemia: Secondary | ICD-10-CM

## 2023-07-25 DIAGNOSIS — E782 Mixed hyperlipidemia: Secondary | ICD-10-CM

## 2023-07-25 DIAGNOSIS — E559 Vitamin D deficiency, unspecified: Secondary | ICD-10-CM

## 2023-07-25 DIAGNOSIS — Z7985 Long-term (current) use of injectable non-insulin antidiabetic drugs: Secondary | ICD-10-CM

## 2023-07-25 LAB — POCT GLYCOSYLATED HEMOGLOBIN (HGB A1C)

## 2023-07-25 MED ORDER — NOVOLOG FLEXPEN 100 UNIT/ML ~~LOC~~ SOPN: 20.0000 [IU] | PEN_INJECTOR | Freq: Three times a day (TID) | SUBCUTANEOUS | Status: DC

## 2023-07-25 MED ORDER — TRESIBA FLEXTOUCH 200 UNIT/ML ~~LOC~~ SOPN: 100.0000 [IU] | PEN_INJECTOR | SUBCUTANEOUS | Status: AC

## 2023-07-25 NOTE — Progress Notes (Signed)
 07/25/2023, 5:38 PM       Endocrinology follow-up note    Subjective:    Patient ID: Todd Carey, male    DOB: [birth year].   Todd Carey is being seen in follow-up for management of currently uncontrolled symptomatic type 2 diabetes, hyperlipidemia, hypertension. PMD:  Vasireddy, Sabitha, MD.   Past Medical History:  Diagnosis Date   Arthritis    Right shoulder    Diabetes mellitus    Hypercholesteremia    Past Surgical History:  Procedure Laterality Date   COLONOSCOPY     COLONOSCOPY N/A 02/08/2016   Procedure: COLONOSCOPY;  Surgeon: Margo LITTIE Haddock, MD;  [facility]: [facility];  Service: Endoscopy;  Laterality: N/A;  10:00 Am   COLONOSCOPY WITH PROPOFOL  N/A 09/27/2021   Procedure: COLONOSCOPY WITH PROPOFOL ;  Surgeon: Cindie Carlin POUR, DO;  Location: [facility];  Service: Endoscopy;  Laterality: N/A;  12:15pm.   POLYPECTOMY  09/27/2021   Procedure: POLYPECTOMY;  Surgeon: Cindie Carlin POUR, DO;  Location: [facility];  Service: Endoscopy;;   Social History   Socioeconomic History   Marital status: Married    Spouse name: Not on file   Number of children: Not on file   Years of education: Not on file   Highest education level: Not on file  Occupational History   Not on file  Tobacco Use   Smoking status: Former    Current packs/day: 0.00    Average packs/day: 1 pack/day for 38.0 years (38.0 ttl pk-yrs)    Types: Cigarettes    Start date: 04/19/1975    Quit date: 04/18/2013    Years since quitting: 10.2   Smokeless tobacco: Former  Building services engineer status: Never Used  Substance and Sexual Activity   Alcohol use: No   Drug use: No   Sexual activity: Not on file  Other Topics Concern   Not on file  Social History Narrative   Not on file   Social Drivers of Health   Financial Resource Strain: Not on file  Food Insecurity: Not on file  Transportation Needs: Not on file  Physical Activity: Not on file  Stress: Not on  file  Social Connections: Not on file   Outpatient Encounter Medications as of 07/25/2023  Medication Sig   amLODipine (NORVASC) 5 MG tablet Take 5 mg by mouth daily.   aspirin 81 MG tablet Take 81 mg by mouth daily.   atorvastatin  (LIPITOR) 40 MG tablet Take 40 mg by mouth daily.   Cholecalciferol (VITAMIN D ) 2000 units tablet Take 2,000 Units by mouth daily.   Continuous Glucose Sensor (FREESTYLE LIBRE 3 PLUS SENSOR) MISC Change sensor every 15 days.   finasteride  (PROSCAR ) 5 MG tablet Take 1 tablet (5 mg total) by mouth daily.   fish oil-omega-3 fatty acids 1000 MG capsule Take 3 g by mouth daily.   Insulin  Pen Needle (PEN NEEDLES) 31G X 8 MM MISC Use to inject insulin  4 times daily   losartan (COZAAR) 100 MG tablet Take 100 mg by mouth daily.   metFORMIN  (GLUCOPHAGE ) 1000 MG tablet Take 1 tablet (1,000 mg total) by mouth 2 (two) times daily with a meal.   Semaglutide , 2 MG/DOSE, (OZEMPIC , 2 MG/DOSE,) 8 MG/3ML SOPN Inject 2 mg into the skin every Saturday.   [DISCONTINUED] Insulin  Aspart (NOVOLOG  FLEXPEN Roberts) Inject 10-16 Units into the skin 3 (three) times daily. (Patient taking differently: Inject 18-24 Units into the skin 3 (  three) times daily.)   [DISCONTINUED] insulin  degludec (TRESIBA  FLEXTOUCH) 200 UNIT/ML FlexTouch Pen Inject 70 Units into the skin at bedtime. (Patient taking differently: Inject 100 Units into the skin at bedtime.)   insulin  aspart (NOVOLOG  FLEXPEN) 100 UNIT/ML FlexPen Inject 20-26 Units into the skin 3 (three) times daily with meals.   insulin  degludec (TRESIBA  FLEXTOUCH) 200 UNIT/ML FlexTouch Pen Inject 100 Units into the skin daily.   [DISCONTINUED] insulin  glargine, 2 Unit Dial , (TOUJEO  MAX SOLOSTAR) 300 UNIT/ML Solostar Pen Inject 80 Units into the skin at bedtime. (Patient not taking: Reported on 07/25/2023)   No facility-administered encounter medications on file as of 07/25/2023.    ALLERGIES: No Known Allergies  VACCINATION STATUS: Immunization History   Administered Date(s) Administered   Influenza,inj,Quad PF,6+ Mos 09/05/2018   Moderna Covid-19 Vaccine Bivalent Booster 75yrs & up 11/09/2020   Moderna Sars-Covid-2 Vaccination 04/16/2019, 12/02/2019, 05/25/2020   Zoster Recombinant(Shingrix) 06/20/2018, 08/24/2018    Diabetes He presents for his follow-up diabetic visit. He has type 2 diabetes mellitus. Onset time: He was diagnosed at approximate age of 68 years. His disease course has been improving. There are no hypoglycemic associated symptoms. Pertinent negatives for hypoglycemia include no confusion, headaches, pallor or seizures. Associated symptoms include fatigue. Pertinent negatives for diabetes include no blurred vision, no chest pain, no polydipsia, no polyphagia, no polyuria and no weakness. There are no hypoglycemic complications. Symptoms are stable. There are no diabetic complications. Risk factors for coronary artery disease include diabetes mellitus, dyslipidemia, family history, male sex, hypertension, obesity, sedentary lifestyle and tobacco exposure. Current diabetic treatment includes intensive insulin  program and oral agent (monotherapy) (an Ozempic ). He is compliant with treatment most of the time. His weight is stable. He is following a generally healthy diet. Meal planning includes avoidance of concentrated sweets. He has not had a previous visit with a dietitian. He participates in exercise daily (walks at the mall daily). His home blood glucose trend is fluctuating minimally. His breakfast blood glucose range is generally >200 mg/dl. His lunch blood glucose range is generally >200 mg/dl. His dinner blood glucose range is generally >200 mg/dl. His bedtime blood glucose range is generally >200 mg/dl. His overall blood glucose range is >200 mg/dl. (He presents today, accompanied by his wife, with his CGM showing above target glycemic profile overall, somewhat improved from last visit.  His POCT A1c today is 9.7%,improving from last  visit of 11.1.  Analysis of his CGM shows TIR 8%, TAR 92% (57% in level 2 hyperglycemia), TBR 0% with a GMI of 9.6%.  He has worked his way back up to 2 mg of Ozempic , is tolerating it well.  He admits he has not been eating very healthy lately, has been drinking a lot of diet sodas.  ) An ACE inhibitor/angiotensin II receptor blocker is being taken. He does not see a podiatrist.Eye exam is current.  Hyperlipidemia This is a chronic problem. The current episode started more than 1 year ago. The problem is controlled. Recent lipid tests were reviewed and are normal. Exacerbating diseases include chronic renal disease, diabetes and obesity. Factors aggravating his hyperlipidemia include fatty foods. Pertinent negatives include no chest pain, myalgias or shortness of breath. Current antihyperlipidemic treatment includes statins. The current treatment provides moderate improvement of lipids. There are no compliance problems.  Risk factors for coronary artery disease include dyslipidemia, diabetes mellitus, family history, male sex, obesity, hypertension and a sedentary lifestyle.  Hypertension This is a chronic problem. The current episode started more than 1  year ago. The problem has been gradually improving since onset. The problem is controlled. Pertinent negatives include no blurred vision, chest pain, headaches, neck pain, palpitations or shortness of breath. There are no associated agents to hypertension. Risk factors for coronary artery disease include dyslipidemia, diabetes mellitus, obesity, male gender, smoking/tobacco exposure and sedentary lifestyle. Past treatments include angiotensin blockers and calcium  channel blockers. The current treatment provides moderate improvement. There are no compliance problems.  Hypertensive end-organ damage includes kidney disease. Identifiable causes of hypertension include chronic renal disease.   Review of systems  Constitutional: +stable body weight,  current Body  mass index is 31.6 kg/m. , no fatigue, no subjective hyperthermia, no subjective hypothermia Eyes: no blurry vision, no xerophthalmia ENT: no sore throat, no nodules palpated in throat, no dysphagia/odynophagia, no hoarseness Cardiovascular: no chest pain, no shortness of breath, no palpitations, no leg swelling Respiratory: no cough, no shortness of breath Gastrointestinal: no nausea/vomiting/diarrhea Musculoskeletal: no muscle/joint aches Skin: no rashes, no hyperemia Neurological: no tremors, no numbness, no tingling, no dizziness Psychiatric: no depression, no anxiety   Objective:    BP 122/62 (BP Location: Left Arm, Patient Position: Sitting, Cuff Size: Large)   Pulse 85   Ht 5' 10 (1.778 m)   Wt 220 lb 3.2 oz (99.9 kg)   BMI 31.60 kg/m   Wt Readings from Last 3 Encounters:  07/25/23 220 lb 3.2 oz (99.9 kg)  04/11/23 220 lb 6.4 oz (100 kg)  01/04/23 218 lb 6.4 oz (99.1 kg)    BP Readings from Last 3 Encounters:  07/25/23 122/62  07/10/23 (!) 152/74  06/05/23 (!) 144/78     Physical Exam- Limited  Constitutional:  Body mass index is 31.6 kg/m. , not in acute distress, normal state of mind Eyes:  EOMI, no exophthalmos Musculoskeletal: no gross deformities, strength intact in all four extremities, no gross restriction of joint movements Skin:  no rashes, no hyperemia Neurological: no tremor with outstretched hands   Diabetic Foot Exam - Simple   Simple Foot Form Diabetic Foot exam was performed with the following findings: Yes 07/25/2023  2:45 PM  Visual Inspection No deformities, no ulcerations, no other skin breakdown bilaterally: Yes Sensation Testing Intact to touch and monofilament testing bilaterally: Yes Pulse Check Posterior Tibialis and Dorsalis pulse intact bilaterally: Yes Comments     Recent Results (from the past 2160 hours)  Urinalysis, Routine w reflex microscopic     Status: Abnormal   Collection Time: 06/05/23 10:24 AM  Result Value Ref  Range   Specific Gravity, UA 1.025 1.005 - 1.030   pH, UA 6.0 5.0 - 7.5   Color, UA Yellow Yellow   Appearance Ur Clear Clear   Leukocytes,UA Negative Negative   Protein,UA 1+ (A) Negative/Trace   Glucose, UA 3+ (A) Negative   Ketones, UA Negative Negative   RBC, UA Negative Negative   Bilirubin, UA Negative Negative   Urobilinogen, Ur 0.2 0.2 - 1.0 mg/dL   Nitrite, UA Negative Negative   Microscopic Examination See below:     Comment: Microscopic was indicated and was performed.  Microscopic Examination     Status: None   Collection Time: 06/05/23 10:24 AM   Urine  Result Value Ref Range   WBC, UA None seen 0 - 5 /hpf   RBC, Urine None seen 0 - 2 /hpf   Epithelial Cells (non renal) 0-10 0 - 10 /hpf   Bacteria, UA None seen None seen/Few  Cytology, urine     Status:  None   Collection Time: 06/05/23 10:42 AM  Result Value Ref Range   Source: Comment     Comment: URINE   Clinician Provided ICD10 Comment     Comment: R36.9 [id number]    Diagnosis: Comment     Comment: URINE NEGATIVE FOR HIGH-GRADE UROTHELIAL CARCINOMA (NHGUC). FEW UROTHELIAL CELLS ARE PRESENT. VERY SCANT CELLULARITY.    Signed out by: Comment     Comment: Charlies LITTIE Hope, MD, Pathologist   Performed By: Comment     Comment: Jon Hamilton, Supervisory Cytologist (ASCP)   Gross description: Comment     Comment: 50 ML, YELLOW, CLEAR /LCS  06/06/2023  0654 Local   Basic metabolic panel with GFR     Status: Abnormal   Collection Time: 06/05/23 10:52 AM  Result Value Ref Range   Glucose 186 (H) 70 - 99 mg/dL   BUN 16 8 - 27 mg/dL   Creatinine, Ser 8.79 0.76 - 1.27 mg/dL   eGFR 66 >40 fO/fpw/8.26   BUN/Creatinine Ratio 13 10 - 24   Sodium 139 134 - 144 mmol/L   Potassium 4.5 3.5 - 5.2 mmol/L   Chloride 102 96 - 106 mmol/L   CO2 21 20 - 29 mmol/L   Calcium  9.6 8.6 - 10.2 mg/dL  BLADDER SCAN AMB NON-IMAGING     Status: None   Collection Time: 06/05/23 11:04 AM  Result Value Ref Range   Scan Result 37    Urinalysis, Routine w reflex microscopic     Status: Abnormal   Collection Time: 07/10/23  1:31 PM  Result Value Ref Range   Specific Gravity, UA 1.030 1.005 - 1.030   pH, UA 6.0 5.0 - 7.5   Color, UA Yellow Yellow   Appearance Ur Clear Clear   Leukocytes,UA Negative Negative   Protein,UA 2+ (A) Negative/Trace   Glucose, UA 3+ (A) Negative   Ketones, UA Trace (A) Negative   RBC, UA Trace (A) Negative   Bilirubin, UA Negative Negative   Urobilinogen, Ur 0.2 0.2 - 1.0 mg/dL   Nitrite, UA Negative Negative   Microscopic Examination See below:     Comment: Microscopic was indicated and was performed.  Microscopic Examination     Status: Abnormal   Collection Time: 07/10/23  1:31 PM   Urine  Result Value Ref Range   WBC, UA 0-5 0 - 5 /hpf   RBC, Urine 3-10 (A) 0 - 2 /hpf   Epithelial Cells (non renal) 0-10 0 - 10 /hpf   Bacteria, UA None seen None seen/Few  HgB A1c     Status: Abnormal   Collection Time: 07/25/23  2:08 PM  Result Value Ref Range   Hemoglobin A1C     HbA1c POC (<> result, manual entry)     HbA1c, POC (prediabetic range)     HbA1c, POC (controlled diabetic range)         Recent Results (from the past 2160 hours)  Urinalysis, Routine w reflex microscopic     Status: Abnormal   Collection Time: 06/05/23 10:24 AM  Result Value Ref Range   Specific Gravity, UA 1.025 1.005 - 1.030   pH, UA 6.0 5.0 - 7.5   Color, UA Yellow Yellow   Appearance Ur Clear Clear   Leukocytes,UA Negative Negative   Protein,UA 1+ (A) Negative/Trace   Glucose, UA 3+ (A) Negative   Ketones, UA Negative Negative   RBC, UA Negative Negative   Bilirubin, UA Negative Negative   Urobilinogen, Ur 0.2 0.2 - 1.0  mg/dL   Nitrite, UA Negative Negative   Microscopic Examination See below:     Comment: Microscopic was indicated and was performed.  Microscopic Examination     Status: None   Collection Time: 06/05/23 10:24 AM   Urine  Result Value Ref Range   WBC, UA None seen 0 - 5 /hpf    RBC, Urine None seen 0 - 2 /hpf   Epithelial Cells (non renal) 0-10 0 - 10 /hpf   Bacteria, UA None seen None seen/Few  Cytology, urine     Status: None   Collection Time: 06/05/23 10:42 AM  Result Value Ref Range   Source: Comment     Comment: URINE   Clinician Provided ICD10 Comment     Comment: R36.9 [id number]    Diagnosis: Comment     Comment: URINE NEGATIVE FOR HIGH-GRADE UROTHELIAL CARCINOMA (NHGUC). FEW UROTHELIAL CELLS ARE PRESENT. VERY SCANT CELLULARITY.    Signed out by: Comment     Comment: Charlies LITTIE Hope, MD, Pathologist   Performed By: Comment     Comment: Jon Hamilton, Supervisory Cytologist (ASCP)   Gross description: Comment     Comment: 50 ML, YELLOW, CLEAR /LCS  06/06/2023  0654 Local   Basic metabolic panel with GFR     Status: Abnormal   Collection Time: 06/05/23 10:52 AM  Result Value Ref Range   Glucose 186 (H) 70 - 99 mg/dL   BUN 16 8 - 27 mg/dL   Creatinine, Ser 8.79 0.76 - 1.27 mg/dL   eGFR 66 >40 fO/fpw/8.26   BUN/Creatinine Ratio 13 10 - 24   Sodium 139 134 - 144 mmol/L   Potassium 4.5 3.5 - 5.2 mmol/L   Chloride 102 96 - 106 mmol/L   CO2 21 20 - 29 mmol/L   Calcium  9.6 8.6 - 10.2 mg/dL  BLADDER SCAN AMB NON-IMAGING     Status: None   Collection Time: 06/05/23 11:04 AM  Result Value Ref Range   Scan Result 37   Urinalysis, Routine w reflex microscopic     Status: Abnormal   Collection Time: 07/10/23  1:31 PM  Result Value Ref Range   Specific Gravity, UA 1.030 1.005 - 1.030   pH, UA 6.0 5.0 - 7.5   Color, UA Yellow Yellow   Appearance Ur Clear Clear   Leukocytes,UA Negative Negative   Protein,UA 2+ (A) Negative/Trace   Glucose, UA 3+ (A) Negative   Ketones, UA Trace (A) Negative   RBC, UA Trace (A) Negative   Bilirubin, UA Negative Negative   Urobilinogen, Ur 0.2 0.2 - 1.0 mg/dL   Nitrite, UA Negative Negative   Microscopic Examination See below:     Comment: Microscopic was indicated and was performed.  Microscopic Examination      Status: Abnormal   Collection Time: 07/10/23  1:31 PM   Urine  Result Value Ref Range   WBC, UA 0-5 0 - 5 /hpf   RBC, Urine 3-10 (A) 0 - 2 /hpf   Epithelial Cells (non renal) 0-10 0 - 10 /hpf   Bacteria, UA None seen None seen/Few  HgB A1c     Status: Abnormal   Collection Time: 07/25/23  2:08 PM  Result Value Ref Range   Hemoglobin A1C     HbA1c POC (<> result, manual entry)     HbA1c, POC (prediabetic range)     HbA1c, POC (controlled diabetic range)          Assessment & Plan:   1) Uncontrolled  type 2 diabetes mellitus with hyperglycemia (HCC)  - Kincade R Lenn has currently uncontrolled symptomatic type 2 DM since 68 years of age.  He presents today, accompanied by his wife, with his CGM showing above target glycemic profile overall, somewhat improved from last visit.  His POCT A1c today is 9.7%,improving from last visit of 11.1.  Analysis of his CGM shows TIR 8%, TAR 92% (57% in level 2 hyperglycemia), TBR 0% with a GMI of 9.6%.  He has worked his way back up to 2 mg of Ozempic , is tolerating it well.  He admits he has not been eating very healthy lately, has been drinking a lot of diet sodas.   - Recent labs reviewed.  -his diabetes is complicated by obesity/sedentary life, and ISAIAHS CHANCY remains at a high risk for more acute and chronic complications which include CAD, CVA, CKD, retinopathy, and neuropathy. These are all discussed in detail with the patient.  - Nutritional counseling repeated at each appointment due to patients tendency to fall back in to old habits.  - The patient admits there is a room for improvement in their diet and drink choices. -  Suggestion is made for the patient to avoid simple carbohydrates from their diet including Cakes, Sweet Desserts / Pastries, Ice Cream, Soda (diet and regular), Sweet Tea, Candies, Chips, Cookies, Sweet Pastries, Store Bought Juices, Alcohol in Excess of 1-2 drinks a day, Artificial Sweeteners, Coffee Creamer, and  Sugar-free Products. This will help patient to have stable blood glucose profile and potentially avoid unintended weight gain.   - I encouraged the patient to switch to unprocessed or minimally processed complex starch and increased protein intake (animal or plant source), fruits, and vegetables.   - Patient is advised to stick to a routine mealtimes to eat 3 meals a day and avoid unnecessary snacks (to snack only to correct hypoglycemia).  -He follows with a dietitian in [place].  - I have approached him with the following individualized plan to manage diabetes and patient agrees:   -He will continue to require intensive treatment with basal/bolus insulin  in order for him to maintain control of diabetes to target.    -He is advised to continue his Tresiba  100 units SQ daily and increase his Novolog  to 20-26 units TID with meals if glucose is above 90 and he is eating (Specific instructions on how to titrate insulin  dosage based on glucose readings given to patient in writing).  He is advised to continue  Metformin  1000 mg po twice daily with meals and Ozempic  2 mg SQ weekly (gets this from PAP).   -He is encouraged to continue using CGM to monitor blood glucose at least 4 times per day, before meals and at bedtime, and call the clinic for readings less than 70 or greater than 200 for 3 tests in a row.  - Patient specific target  A1c;  LDL, HDL, Triglycerides, and  Waist Circumference were discussed in detail.  2) BP/HTN:  His blood pressure is controlled to target.  He is advised to continue his current medications as prescribed by his PCP.  3) Lipids/HPL:  His most recent lipid panel from 04/25/22 shows controlled LDL at 23 and Triglycerides of 121.  He is advised to continue Atorvastatin  40 mg po daily at bedtime, and Fish Oil 3g po daily.   4)  Weight/Diet:  His Body mass index is 31.6 kg/m.-clearly complicating the care of his diabetes.  He is a candidate for modest weight loss.  CDE  Consult will be initiated , exercise, and detailed carbohydrates information provided.  5) Chronic Care/Health Maintenance: -he is on ACEI/ARB and Statin medications and is encouraged to continue to follow up with Ophthalmology, Dentist,  Podiatrist at least yearly or according to recommendations, and advised to  stay away from smoking. I have recommended yearly flu vaccine and pneumonia vaccination at least every 5 years; moderate intensity exercise for up to 150 minutes weekly; and  sleep for at least 7 hours a day.  - I advised patient to maintain close follow up with Vasireddy, Sabitha, MD for primary care needs.     I spent  46  minutes in the care of the patient today including review of labs from CMP, Lipids, Thyroid Function, Hematology (current and previous including abstractions from other facilities); face-to-face time discussing  his blood glucose readings/logs, discussing hypoglycemia and hyperglycemia episodes and symptoms, medications doses, his options of short and long term treatment based on the latest standards of care / guidelines;  discussion about incorporating lifestyle medicine;  and documenting the encounter. Risk reduction counseling performed per USPSTF guidelines to reduce obesity and cardiovascular risk factors.     Please refer to Patient Instructions for Blood Glucose Monitoring and Insulin /Medications Dosing Guide  in media tab for additional information. Please  also refer to  Patient Self Inventory in the Media  tab for reviewed elements of pertinent patient history.  Salomon JONELLE Penner participated in the discussions, expressed understanding, and voiced agreement with the above plans.  All questions were answered to his satisfaction. he is encouraged to contact clinic should he have any questions or concerns prior to his return visit.    Follow up plan: - Return in about 3 months (around 10/25/2023) for Diabetes F/U with A1c in office, No previsit labs, Bring meter  and logs.  Benton Rio, [facility] [address] [place], [place] [address] Phone: [phone] Fax: [phone]  07/25/2023, 5:38 PM

## 2023-07-25 NOTE — Telephone Encounter (Signed)
 Pt picked up PAP of pen needles

## 2023-08-14 ENCOUNTER — Encounter: Payer: Self-pay | Admitting: Internal Medicine

## 2023-08-22 LAB — TSH: TSH: 1.18

## 2023-08-22 LAB — COMPREHENSIVE METABOLIC PANEL WITH GFR: EGFR: 88

## 2023-08-31 ENCOUNTER — Telehealth: Payer: Self-pay | Admitting: Nurse Practitioner

## 2023-08-31 NOTE — Telephone Encounter (Signed)
 Called and lvm to let pt know pt assistance of tresiba , ozempic  and novolog  were ready for pick up

## 2023-09-03 NOTE — Telephone Encounter (Signed)
 Pt picked up pt assistance.

## 2023-09-10 NOTE — Telephone Encounter (Signed)
 Patient picked up his patient assistance

## 2023-09-10 NOTE — Telephone Encounter (Signed)
 Called pt to let him know that his Tresiba . I had to leave a VM.

## 2023-11-07 ENCOUNTER — Ambulatory Visit: Admitting: Nurse Practitioner

## 2023-11-07 ENCOUNTER — Encounter: Payer: Self-pay | Admitting: Nurse Practitioner

## 2023-11-07 ENCOUNTER — Telehealth: Payer: Self-pay | Admitting: Nurse Practitioner

## 2023-11-07 VITALS — BP 122/60 | HR 76 | Ht 70.0 in | Wt 219.6 lb

## 2023-11-07 DIAGNOSIS — Z794 Long term (current) use of insulin: Secondary | ICD-10-CM

## 2023-11-07 DIAGNOSIS — I10 Essential (primary) hypertension: Secondary | ICD-10-CM | POA: Diagnosis not present

## 2023-11-07 DIAGNOSIS — E782 Mixed hyperlipidemia: Secondary | ICD-10-CM | POA: Diagnosis not present

## 2023-11-07 DIAGNOSIS — E1165 Type 2 diabetes mellitus with hyperglycemia: Secondary | ICD-10-CM | POA: Diagnosis not present

## 2023-11-07 DIAGNOSIS — E559 Vitamin D deficiency, unspecified: Secondary | ICD-10-CM

## 2023-11-07 DIAGNOSIS — Z7984 Long term (current) use of oral hypoglycemic drugs: Secondary | ICD-10-CM

## 2023-11-07 DIAGNOSIS — Z7985 Long-term (current) use of injectable non-insulin antidiabetic drugs: Secondary | ICD-10-CM

## 2023-11-07 LAB — POCT GLYCOSYLATED HEMOGLOBIN (HGB A1C): Hemoglobin A1C: 9.3 % — AB (ref 4.0–5.6)

## 2023-11-07 MED ORDER — TIRZEPATIDE 7.5 MG/0.5ML ~~LOC~~ SOAJ: 7.5000 mg | SUBCUTANEOUS | 1 refills | Status: DC

## 2023-11-07 NOTE — Telephone Encounter (Signed)
 Bummer!  Have him continue the Ozempic  for now.  Let me find out what other options would be available to him in the future since Novo Nordisk wont be covering this any longer for him.

## 2023-11-07 NOTE — Telephone Encounter (Signed)
 Patient made aware.

## 2023-11-07 NOTE — Telephone Encounter (Signed)
 Patient said that his Mounjaro is going to cost over $1,000. Please Advise

## 2023-11-07 NOTE — Progress Notes (Signed)
 11/07/2023, 3:25 PM       Endocrinology follow-up note    Subjective:    Patient ID: Todd Carey, male    DOB: 1955-04-13.   Todd Carey is being seen in follow-up for management of currently uncontrolled symptomatic type 2 diabetes, hyperlipidemia, hypertension. PMD:  Vasireddy, Sabitha, MD.   Past Medical History:  Diagnosis Date   Arthritis    Right shoulder    Diabetes mellitus    Hypercholesteremia    Past Surgical History:  Procedure Laterality Date   COLONOSCOPY     COLONOSCOPY N/A 02/08/2016   Procedure: COLONOSCOPY;  Surgeon: Margo LITTIE Haddock, MD;  [facility];  Service: Endoscopy;  Laterality: N/A;  10:00 Am   COLONOSCOPY WITH PROPOFOL  N/A 09/27/2021   Procedure: COLONOSCOPY WITH PROPOFOL ;  Surgeon: Cindie Carlin POUR, DO;  Location: [facility];  Service: Endoscopy;  Laterality: N/A;  12:15pm.   POLYPECTOMY  09/27/2021   Procedure: POLYPECTOMY;  Surgeon: Cindie Carlin POUR, DO;  Location: [facility];  Service: Endoscopy;;   Social History   Socioeconomic History   Marital status: Married    Spouse name: Not on file   Number of children: Not on file   Years of education: Not on file   Highest education level: Not on file  Occupational History   Not on file  Tobacco Use   Smoking status: Former    Current packs/day: 0.00    Average packs/day: 1 pack/day for 38.0 years (38.0 ttl pk-yrs)    Types: Cigarettes    Start date: 04/19/1975    Quit date: 04/18/2013    Years since quitting: 10.5   Smokeless tobacco: Former  Building services engineer status: Never Used  Substance and Sexual Activity   Alcohol use: No   Drug use: No   Sexual activity: Not on file  Other Topics Concern   Not on file  Social History Narrative   Not on file   Social Drivers of Health   Financial Resource Strain: Not on file  Food Insecurity: Not on file  Transportation Needs: Not on file  Physical Activity: Not on file  Stress: Not on  file  Social Connections: Not on file   Outpatient Encounter Medications as of 11/07/2023  Medication Sig   amLODipine (NORVASC) 5 MG tablet Take 5 mg by mouth daily.   aspirin 81 MG tablet Take 81 mg by mouth daily.   atorvastatin  (LIPITOR) 40 MG tablet Take 40 mg by mouth daily.   Cholecalciferol (VITAMIN D ) 2000 units tablet Take 2,000 Units by mouth daily.   Continuous Glucose Sensor (FREESTYLE LIBRE 3 PLUS SENSOR) MISC Change sensor every 15 days.   finasteride  (PROSCAR ) 5 MG tablet Take 1 tablet (5 mg total) by mouth daily.   fish oil-omega-3 fatty acids 1000 MG capsule Take 3 g by mouth daily.   insulin  aspart (NOVOLOG  FLEXPEN) 100 UNIT/ML FlexPen Inject 20-26 Units into the skin 3 (three) times daily with meals.   insulin  degludec (TRESIBA  FLEXTOUCH) 200 UNIT/ML FlexTouch Pen Inject 100 Units into the skin daily.   Insulin  Pen Needle (PEN NEEDLES) 31G X 8 MM MISC Use to inject insulin  4 times daily   losartan (COZAAR) 100 MG tablet Take 100 mg by mouth daily.   metFORMIN  (GLUCOPHAGE ) 1000 MG tablet Take 1 tablet (1,000 mg total) by mouth 2 (two) times daily with a meal.   tirzepatide (MOUNJARO) 7.5 MG/0.5ML Pen Inject 7.5  mg into the skin once a week.   [DISCONTINUED] Semaglutide , 2 MG/DOSE, (OZEMPIC , 2 MG/DOSE,) 8 MG/3ML SOPN Inject 2 mg into the skin every Saturday.   No facility-administered encounter medications on file as of 11/07/2023.    ALLERGIES: No Known Allergies  VACCINATION STATUS: Immunization History  Administered Date(s) Administered   Influenza,inj,Quad PF,6+ Mos 09/05/2018   Moderna Covid-19 Vaccine Bivalent Booster 36yrs & up 11/09/2020   Moderna Sars-Covid-2 Vaccination 04/16/2019, 12/02/2019, 05/25/2020   Zoster Recombinant(Shingrix) 06/20/2018, 08/24/2018    Diabetes He presents for his follow-up diabetic visit. He has type 2 diabetes mellitus. Onset time: He was diagnosed at approximate age of 45 years. His disease course has been improving. There are  no hypoglycemic associated symptoms. Pertinent negatives for hypoglycemia include no confusion, headaches, pallor or seizures. Associated symptoms include fatigue. Pertinent negatives for diabetes include no blurred vision, no chest pain, no polydipsia, no polyphagia, no polyuria and no weakness. There are no hypoglycemic complications. Symptoms are stable. There are no diabetic complications. Risk factors for coronary artery disease include diabetes mellitus, dyslipidemia, family history, male sex, hypertension, obesity, sedentary lifestyle and tobacco exposure. Current diabetic treatment includes intensive insulin  program and oral agent (monotherapy) (an Ozempic ). He is compliant with treatment most of the time. His weight is stable. He is following a generally healthy diet. Meal planning includes avoidance of concentrated sweets. He has not had a previous visit with a dietitian. He participates in exercise daily (walks at the mall daily). His home blood glucose trend is decreasing steadily. His breakfast blood glucose range is generally >200 mg/dl. His lunch blood glucose range is generally >200 mg/dl. His dinner blood glucose range is generally >200 mg/dl. His bedtime blood glucose range is generally >200 mg/dl. His overall blood glucose range is >200 mg/dl. (He presents today, with his CGM showing above target glycemic profile overall, somewhat improved from last visit.  His POCT A1c today is 9.3%,improving from last visit of 11.1%.  Analysis of his CGM shows TIR 27%, TAR 73% (21% in level 2 hyperglycemia), TBR 0% with a GMI of 8.4%.   ) An ACE inhibitor/angiotensin II receptor blocker is being taken. He does not see a podiatrist.Eye exam is current.  Hyperlipidemia This is a chronic problem. The current episode started more than 1 year ago. The problem is controlled. Recent lipid tests were reviewed and are normal. Exacerbating diseases include chronic renal disease, diabetes and obesity. Factors aggravating  his hyperlipidemia include fatty foods. Pertinent negatives include no chest pain, myalgias or shortness of breath. Current antihyperlipidemic treatment includes statins. The current treatment provides moderate improvement of lipids. There are no compliance problems.  Risk factors for coronary artery disease include dyslipidemia, diabetes mellitus, family history, male sex, obesity, hypertension and a sedentary lifestyle.  Hypertension This is a chronic problem. The current episode started more than 1 year ago. The problem has been gradually improving since onset. The problem is controlled. Pertinent negatives include no blurred vision, chest pain, headaches, neck pain, palpitations or shortness of breath. There are no associated agents to hypertension. Risk factors for coronary artery disease include dyslipidemia, diabetes mellitus, obesity, male gender, smoking/tobacco exposure and sedentary lifestyle. Past treatments include angiotensin blockers and calcium  channel blockers. The current treatment provides moderate improvement. There are no compliance problems.  Hypertensive end-organ damage includes kidney disease. Identifiable causes of hypertension include chronic renal disease.   Review of systems  Constitutional: +stable body weight,  current Body mass index is 31.51 kg/m. , no fatigue, no  subjective hyperthermia, no subjective hypothermia Eyes: no blurry vision, no xerophthalmia ENT: no sore throat, no nodules palpated in throat, no dysphagia/odynophagia, no hoarseness Cardiovascular: no chest pain, no shortness of breath, no palpitations, no leg swelling Respiratory: no cough, no shortness of breath Gastrointestinal: no nausea/vomiting/diarrhea Musculoskeletal: no muscle/joint aches Skin: no rashes, no hyperemia Neurological: no tremors, no numbness, no tingling, no dizziness Psychiatric: no depression, no anxiety   Objective:    BP 122/60 (BP Location: Right Arm, Patient Position:  Sitting, Cuff Size: Large)   Pulse 76   Ht 5' 10 (1.778 m)   Wt 219 lb 9.6 oz (99.6 kg)   BMI 31.51 kg/m   Wt Readings from Last 3 Encounters:  11/07/23 219 lb 9.6 oz (99.6 kg)  07/25/23 220 lb 3.2 oz (99.9 kg)  04/11/23 220 lb 6.4 oz (100 kg)    BP Readings from Last 3 Encounters:  11/07/23 122/60  07/25/23 122/62  07/10/23 (!) 152/74     Physical Exam- Limited  Constitutional:  Body mass index is 31.51 kg/m. , not in acute distress, normal state of mind Eyes:  EOMI, no exophthalmos Musculoskeletal: no gross deformities, strength intact in all four extremities, no gross restriction of joint movements Skin:  no rashes, no hyperemia Neurological: no tremor with outstretched hands   Diabetic Foot Exam - Simple   No data filed     Recent Results (from the past 2160 hours)  HgB A1c     Status: Abnormal   Collection Time: 11/07/23  3:09 PM  Result Value Ref Range   Hemoglobin A1C 9.3 (A) 4.0 - 5.6 %   HbA1c POC (<> result, manual entry)     HbA1c, POC (prediabetic range)     HbA1c, POC (controlled diabetic range)          Recent Results (from the past 2160 hours)  HgB A1c     Status: Abnormal   Collection Time: 11/07/23  3:09 PM  Result Value Ref Range   Hemoglobin A1C 9.3 (A) 4.0 - 5.6 %   HbA1c POC (<> result, manual entry)     HbA1c, POC (prediabetic range)     HbA1c, POC (controlled diabetic range)           Assessment & Plan:   1) Uncontrolled type 2 diabetes mellitus with hyperglycemia (HCC)  - Hawk R Panther has currently uncontrolled symptomatic type 2 DM since 68 years of age.  He presents today, with his CGM showing above target glycemic profile overall, somewhat improved from last visit.  His POCT A1c today is 9.3%,improving from last visit of 11.1%.  Analysis of his CGM shows TIR 27%, TAR 73% (21% in level 2 hyperglycemia), TBR 0% with a GMI of 8.4%.     - Recent labs reviewed.  -his diabetes is complicated by obesity/sedentary life, and  JACIER GLADU remains at a high risk for more acute and chronic complications which include CAD, CVA, CKD, retinopathy, and neuropathy. These are all discussed in detail with the patient.  - Nutritional counseling repeated at each appointment due to patients tendency to fall back in to old habits.  - The patient admits there is a room for improvement in their diet and drink choices. -  Suggestion is made for the patient to avoid simple carbohydrates from their diet including Cakes, Sweet Desserts / Pastries, Ice Cream, Soda (diet and regular), Sweet Tea, Candies, Chips, Cookies, Sweet Pastries, Store Bought Juices, Alcohol in Excess of 1-2 drinks a day, Artificial  Sweeteners, Coffee Creamer, and Sugar-free Products. This will help patient to have stable blood glucose profile and potentially avoid unintended weight gain.   - I encouraged the patient to switch to unprocessed or minimally processed complex starch and increased protein intake (animal or plant source), fruits, and vegetables.   - Patient is advised to stick to a routine mealtimes to eat 3 meals a day and avoid unnecessary snacks (to snack only to correct hypoglycemia).  -He follows with a dietitian in [place].  - I have approached him with the following individualized plan to manage diabetes and patient agrees:   -He will continue to require intensive treatment with basal/bolus insulin  in order for him to maintain control of diabetes to target.    -He is advised to continue his Tresiba  100 units SQ daily and Novolog  20-26 units TID with meals if glucose is above 90 and he is eating (Specific instructions on how to titrate insulin  dosage based on glucose readings given to patient in writing).  He is advised to continue  Metformin  1000 mg po twice daily with meals and Ozempic  2 mg SQ weekly (gets this from PAP).   Was notified that Novo Nordisk will no longer be covering Ozempic  for Medicare part D, which may impact his ability to get  this medication.  I did send in script for Mounjaro 7.5 mg SQ weekly as an alternative.  He is advised to reach out if he has trouble getting it or if it is unaffordable.  I did also encourage him to talk to insurance broker about different plans that offer more coverage for his diabetes medications and supplies.  He has an appointment with one coming up.  -He is encouraged to continue using CGM to monitor blood glucose at least 4 times per day, before meals and at bedtime, and call the clinic for readings less than 70 or greater than 200 for 3 tests in a row.  - Patient specific target  A1c;  LDL, HDL, Triglycerides, and  Waist Circumference were discussed in detail.  2) BP/HTN:  His blood pressure is controlled to target.  He is advised to continue his current medications as prescribed by his PCP.  3) Lipids/HPL:  His most recent lipid panel from 04/25/22 shows controlled LDL at 23 and Triglycerides of 121.  He is advised to continue Atorvastatin  40 mg po daily at bedtime, and Fish Oil 3g po daily.   4)  Weight/Diet:  His Body mass index is 31.51 kg/m.-clearly complicating the care of his diabetes.  He is a candidate for modest weight loss.  CDE Consult will be initiated , exercise, and detailed carbohydrates information provided.  5) Chronic Care/Health Maintenance: -he is on ACEI/ARB and Statin medications and is encouraged to continue to follow up with Ophthalmology, Dentist,  Podiatrist at least yearly or according to recommendations, and advised to  stay away from smoking. I have recommended yearly flu vaccine and pneumonia vaccination at least every 5 years; moderate intensity exercise for up to 150 minutes weekly; and  sleep for at least 7 hours a day.  - I advised patient to maintain close follow up with Vasireddy, Sabitha, MD for primary care needs.     I spent  28  minutes in the care of the patient today including review of labs from CMP, Lipids, Thyroid Function, Hematology  (current and previous including abstractions from other facilities); face-to-face time discussing  his blood glucose readings/logs, discussing hypoglycemia and hyperglycemia episodes and symptoms, medications doses,  his options of short and long term treatment based on the latest standards of care / guidelines;  discussion about incorporating lifestyle medicine;  and documenting the encounter. Risk reduction counseling performed per USPSTF guidelines to reduce obesity and cardiovascular risk factors.     Please refer to Patient Instructions for Blood Glucose Monitoring and Insulin /Medications Dosing Guide  in media tab for additional information. Please  also refer to  Patient Self Inventory in the Media  tab for reviewed elements of pertinent patient history.  Salomon JONELLE Penner participated in the discussions, expressed understanding, and voiced agreement with the above plans.  All questions were answered to his satisfaction. he is encouraged to contact clinic should he have any questions or concerns prior to his return visit.    Follow up plan: - Return in about 3 months (around 02/07/2024) for Diabetes F/U with A1c in office, No previsit labs, Bring meter and logs.  Benton Rio, [facility] [address] [place], [place] [address] Phone: [phone] Fax: [phone]  11/07/2023, 3:25 PM

## 2023-11-23 ENCOUNTER — Telehealth: Payer: Self-pay | Admitting: *Deleted

## 2023-11-23 ENCOUNTER — Other Ambulatory Visit: Payer: Self-pay | Admitting: Nurse Practitioner

## 2023-11-23 ENCOUNTER — Other Ambulatory Visit: Payer: Self-pay | Admitting: *Deleted

## 2023-11-23 DIAGNOSIS — Z7984 Long term (current) use of oral hypoglycemic drugs: Secondary | ICD-10-CM

## 2023-11-23 DIAGNOSIS — Z794 Long term (current) use of insulin: Secondary | ICD-10-CM

## 2023-11-23 DIAGNOSIS — Z7985 Long-term (current) use of injectable non-insulin antidiabetic drugs: Secondary | ICD-10-CM

## 2023-11-23 DIAGNOSIS — E1165 Type 2 diabetes mellitus with hyperglycemia: Secondary | ICD-10-CM

## 2023-11-23 MED ORDER — NOVOLOG FLEXPEN 100 UNIT/ML ~~LOC~~ SOPN: 20.0000 [IU] | PEN_INJECTOR | Freq: Three times a day (TID) | SUBCUTANEOUS | 0 refills | Status: DC

## 2023-11-23 NOTE — Telephone Encounter (Signed)
 Patient called the office , his message stated that he was about out of the Novolog  Insulin  that he gets from the PAP. He ask that a prescription be sent to the [facility] in [place] on [address]. A prescription was sent in. Patient was made aware.

## 2023-11-26 MED ORDER — INSULIN ASPART FLEXPEN 100 UNIT/ML ~~LOC~~ SOPN: 20.0000 [IU] | PEN_INJECTOR | Freq: Three times a day (TID) | SUBCUTANEOUS | 0 refills | Status: AC

## 2023-11-26 NOTE — Telephone Encounter (Signed)
 Switched pt to Aspart (Novolog ) Flexpen.

## 2023-11-28 ENCOUNTER — Telehealth: Payer: Self-pay | Admitting: Nurse Practitioner

## 2023-11-28 NOTE — Telephone Encounter (Signed)
 Called and let pt know PAP of Tresiba , Novolog , Ozempic  and pen needles is ready for pick up

## 2023-11-28 NOTE — Telephone Encounter (Signed)
 Pt picked up PAP of tresiba , novolog , ozempic  and pen needles

## 2023-12-05 LAB — LAB REPORT - SCANNED
A1c: 9.3
EGFR: 83
HM Hepatitis Screen: NEGATIVE
TSH: 0.97 (ref 0.41–5.90)

## 2024-01-07 ENCOUNTER — Telehealth: Payer: Self-pay | Admitting: Nurse Practitioner

## 2024-01-07 NOTE — Telephone Encounter (Signed)
 Patient aware that his PAP is here for pick up

## 2024-01-08 NOTE — Telephone Encounter (Signed)
 Pt picked up PAP

## 2024-01-14 NOTE — Progress Notes (Unsigned)
 Impression/Assessment:  Hematuria with negative evaluation for malignancy.  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 Seen initially by Lauraine Oz, NP for bloody penile dc.   he has h/o tobacco use.  He has undergone CT hematuria protocol. This revealed a large prostate and small Lt sided non obstructing renal stones as well as (B) simple renal cysts  Urinary cytology was negative for high-grade urothelial carcinoma.    Past Medical History:  Diagnosis Date   Arthritis    Right shoulder    Diabetes mellitus    Hypercholesteremia     Past Surgical History:  Procedure Laterality Date   COLONOSCOPY     COLONOSCOPY N/A 02/08/2016   Procedure: COLONOSCOPY;  Surgeon: Margo LITTIE Haddock, MD;  [facility];  Service: Endoscopy;  Laterality: N/A;  10:00 Am   COLONOSCOPY WITH PROPOFOL  N/A 09/27/2021   Procedure: COLONOSCOPY WITH PROPOFOL ;  Surgeon: Cindie Carlin POUR, DO;  [facility];  Service: Endoscopy;  Laterality: N/A;  12:15pm.   POLYPECTOMY  09/27/2021   Procedure: POLYPECTOMY;  Surgeon: Cindie Carlin POUR, DO;  [facility];  Service: Endoscopy;;    Home Medications:  Allergies as of 01/15/2024   No Known Allergies      Medication List        Accurate as of January 14, 2024  1:53 PM. If you have any questions, ask your nurse or doctor.          amLODipine 5 MG tablet Commonly known as: NORVASC Take 5 mg by mouth daily.   aspirin 81 MG tablet Take 81 mg by mouth daily.   atorvastatin  40 MG tablet Commonly known as: LIPITOR Take 40 mg by mouth daily.   finasteride  5 MG tablet Commonly known as: PROSCAR  Take 1 tablet (5 mg total) by mouth daily.   fish oil-omega-3 fatty acids 1000 MG capsule Take 3 g by mouth daily.   FreeStyle Libre 3 Plus Sensor Misc Change sensor every 15 days.   Insulin  Aspart FlexPen 100  UNIT/ML Commonly known as: NOVOLOG  Inject 20-26 Units into the skin 3 (three) times daily with meals.   losartan 100 MG tablet Commonly known as: COZAAR Take 100 mg by mouth daily.   metFORMIN  1000 MG tablet Commonly known as: GLUCOPHAGE  Take 1 tablet (1,000 mg total) by mouth 2 (two) times daily with a meal.   Pen Needles 31G X 8 MM Misc Use to inject insulin  4 times daily   tirzepatide  7.5 MG/0.5ML Pen Commonly known as: MOUNJARO  Inject 7.5 mg into the skin once a week.   Tresiba  FlexTouch 200 UNIT/ML FlexTouch Pen Generic drug: insulin  degludec Inject 100 Units into the skin daily.   Vitamin D  50 MCG (2000 UT) tablet Take 2,000 Units by mouth daily.        Allergies: No Known Allergies  Family History  Problem Relation Age of Onset   Hyperlipidemia Mother    CAD Father    Colon cancer Neg Hx     Social History:  reports that he quit smoking about 10 years ago. His smoking use included cigarettes. He started smoking about 48 years ago. He has a 38 pack-year smoking history. He has quit using smokeless tobacco. He reports that he does not drink alcohol and does not use drugs.  ROS: A complete review of systems was performed.  All systems are  negative except for pertinent findings as noted.  Physical Exam:  Vital signs in last 24 hours: There were no vitals taken for this visit. Constitutional:  Alert and oriented, No acute distress Cardiovascular: Regular rate  Respiratory: Normal respiratory effort GI: Abdomen is soft, nontender, nondistended, no abdominal masses. No CVAT.  Genitourinary: Normal male phallus, testes are descended bilaterally and non-tender and without masses, scrotum is normal in appearance without lesions or masses, perineum is normal on inspection. Lymphatic: No lymphadenopathy Neurologic: Grossly intact, no focal deficits Psychiatric: Normal mood and affect  I have reviewed prior pt notes  I have reviewed urinalysis results  I have  independently reviewed prior imaging--CT images viewed  I have reviewed prior PSA results  I have reviewed prior urine culture

## 2024-01-15 ENCOUNTER — Ambulatory Visit: Admitting: Urology

## 2024-01-15 VITALS — BP 149/64 | HR 75

## 2024-01-15 DIAGNOSIS — N200 Calculus of kidney: Secondary | ICD-10-CM

## 2024-01-15 DIAGNOSIS — N401 Enlarged prostate with lower urinary tract symptoms: Secondary | ICD-10-CM

## 2024-01-15 DIAGNOSIS — R369 Urethral discharge, unspecified: Secondary | ICD-10-CM

## 2024-01-15 DIAGNOSIS — N281 Cyst of kidney, acquired: Secondary | ICD-10-CM

## 2024-01-16 LAB — URINALYSIS, ROUTINE W REFLEX MICROSCOPIC
Bilirubin, UA: NEGATIVE
Leukocytes,UA: NEGATIVE
Nitrite, UA: NEGATIVE
Specific Gravity, UA: 1.025 (ref 1.005–1.030)
Urobilinogen, Ur: 0.2 mg/dL (ref 0.2–1.0)
pH, UA: 6 (ref 5.0–7.5)

## 2024-01-16 LAB — MICROSCOPIC EXAMINATION: Bacteria, UA: NONE SEEN

## 2024-02-07 ENCOUNTER — Encounter: Payer: Self-pay | Admitting: Nurse Practitioner

## 2024-02-07 ENCOUNTER — Ambulatory Visit (INDEPENDENT_AMBULATORY_CARE_PROVIDER_SITE_OTHER): Admitting: Nurse Practitioner

## 2024-02-07 VITALS — BP 112/66 | HR 83 | Ht 70.0 in | Wt 224.8 lb

## 2024-02-07 DIAGNOSIS — Z794 Long term (current) use of insulin: Secondary | ICD-10-CM

## 2024-02-07 DIAGNOSIS — I10 Essential (primary) hypertension: Secondary | ICD-10-CM | POA: Diagnosis not present

## 2024-02-07 DIAGNOSIS — E782 Mixed hyperlipidemia: Secondary | ICD-10-CM | POA: Diagnosis not present

## 2024-02-07 DIAGNOSIS — E559 Vitamin D deficiency, unspecified: Secondary | ICD-10-CM | POA: Diagnosis not present

## 2024-02-07 DIAGNOSIS — Z7984 Long term (current) use of oral hypoglycemic drugs: Secondary | ICD-10-CM

## 2024-02-07 DIAGNOSIS — E1165 Type 2 diabetes mellitus with hyperglycemia: Secondary | ICD-10-CM

## 2024-02-07 DIAGNOSIS — Z7985 Long-term (current) use of injectable non-insulin antidiabetic drugs: Secondary | ICD-10-CM | POA: Diagnosis not present

## 2024-02-07 LAB — POCT GLYCOSYLATED HEMOGLOBIN (HGB A1C): Hemoglobin A1C: 9.3 % — AB (ref 4.0–5.6)

## 2024-02-07 MED ORDER — TIRZEPATIDE 10 MG/0.5ML ~~LOC~~ SOAJ: 10.0000 mg | SUBCUTANEOUS | 1 refills | Status: AC

## 2024-02-07 MED ORDER — METFORMIN HCL 1000 MG PO TABS: 1000.0000 mg | ORAL_TABLET | Freq: Two times a day (BID) | ORAL | 3 refills | Status: AC

## 2024-02-07 MED ORDER — FREESTYLE LIBRE 3 PLUS SENSOR MISC: 3 refills | Status: AC

## 2024-02-07 NOTE — Progress Notes (Signed)
 "                          02/07/2024, 4:05 PM       Endocrinology follow-up note    Subjective:    Patient ID: Todd Carey, male    DOB: [birth year].   Todd Carey is being seen in follow-up for management of currently uncontrolled symptomatic type 2 diabetes, hyperlipidemia, hypertension. PMD:  Vasireddy, Sabitha, MD.   Past Medical History:  Diagnosis Date   Arthritis    Right shoulder    Diabetes mellitus    Hypercholesteremia    Past Surgical History:  Procedure Laterality Date   COLONOSCOPY     COLONOSCOPY N/A 02/08/2016   Procedure: COLONOSCOPY;  Surgeon: Margo LITTIE Haddock, MD;  [facility]: [facility];  Service: Endoscopy;  Laterality: N/A;  10:00 Am   COLONOSCOPY WITH PROPOFOL  N/A 09/27/2021   Procedure: COLONOSCOPY WITH PROPOFOL ;  Surgeon: Cindie Carlin POUR, DO;  Location: [facility];  Service: Endoscopy;  Laterality: N/A;  12:15pm.   POLYPECTOMY  09/27/2021   Procedure: POLYPECTOMY;  Surgeon: Cindie Carlin POUR, DO;  Location: [facility];  Service: Endoscopy;;   Social History   Socioeconomic History   Marital status: Married    Spouse name: Not on file   Number of children: Not on file   Years of education: Not on file   Highest education level: Not on file  Occupational History   Not on file  Tobacco Use   Smoking status: Former    Current packs/day: 0.00    Average packs/day: 1 pack/day for 38.0 years (38.0 ttl pk-yrs)    Types: Cigarettes    Start date: 04/19/1975    Quit date: 04/18/2013    Years since quitting: 10.8   Smokeless tobacco: Former  Building Services Engineer status: Never Used  Substance and Sexual Activity   Alcohol use: No   Drug use: No   Sexual activity: Not on file  Other Topics Concern   Not on file  Social History Narrative   Not on file   Social Drivers of Health   Tobacco Use: Medium Risk (02/07/2024)   Patient History    Smoking Tobacco Use: Former    Smokeless Tobacco Use: Former    Passive Exposure: Not on Furniture Conservator/restorer: Not on Ship Broker Insecurity: Not on file  Transportation Needs: Not on file  Physical Activity: Not on file  Stress: Not on file  Social Connections: Not on file  Depression (PHQ2-9): Not on file  Alcohol Screen: Not on file  Housing: Not on file  Utilities: Not on file  Health Literacy: Not on file   Outpatient Encounter Medications as of 02/07/2024  Medication Sig   amLODipine (NORVASC) 5 MG tablet Take 5 mg by mouth daily.   aspirin 81 MG tablet Take 81 mg by mouth daily.   atorvastatin  (LIPITOR) 40 MG tablet Take 40 mg by mouth daily.   Cholecalciferol (VITAMIN D ) 2000 units tablet Take 2,000 Units by mouth daily.   finasteride  (PROSCAR ) 5 MG tablet Take 1 tablet (5 mg total) by mouth daily.   fish oil-omega-3 fatty acids 1000 MG capsule Take 3 g by mouth daily.   Insulin  Aspart FlexPen (NOVOLOG ) 100 UNIT/ML Inject 20-26 Units into the skin 3 (three) times daily with meals.   insulin  degludec (TRESIBA  FLEXTOUCH) 200 UNIT/ML FlexTouch Pen Inject 100 Units into the skin daily.  Insulin  Pen Needle (PEN NEEDLES) 31G X 8 MM MISC Use to inject insulin  4 times daily   losartan (COZAAR) 100 MG tablet Take 100 mg by mouth daily.   tirzepatide  (MOUNJARO ) 10 MG/0.5ML Pen Inject 10 mg into the skin once a week.   [DISCONTINUED] Continuous Glucose Sensor (FREESTYLE LIBRE 3 PLUS SENSOR) MISC Change sensor every 15 days.   [DISCONTINUED] metFORMIN  (GLUCOPHAGE ) 1000 MG tablet Take 1 tablet (1,000 mg total) by mouth 2 (two) times daily with a meal.   [DISCONTINUED] tirzepatide  (MOUNJARO ) 7.5 MG/0.5ML Pen Inject 7.5 mg into the skin once a week.   Continuous Glucose Sensor (FREESTYLE LIBRE 3 PLUS SENSOR) MISC Change sensor every 15 days.   metFORMIN  (GLUCOPHAGE ) 1000 MG tablet Take 1 tablet (1,000 mg total) by mouth 2 (two) times daily with a meal.   No facility-administered encounter medications on file as of 02/07/2024.    ALLERGIES: No Known Allergies  VACCINATION  STATUS: Immunization History  Administered Date(s) Administered   Influenza,inj,Quad PF,6+ Mos 09/05/2018   Moderna Covid-19 Vaccine Bivalent Booster 19yrs & up 11/09/2020   Moderna Sars-Covid-2 Vaccination 04/16/2019, 12/02/2019, 05/25/2020   Zoster Recombinant(Shingrix) 06/20/2018, 08/24/2018    Diabetes He presents for his follow-up diabetic visit. He has type 2 diabetes mellitus. Onset time: He was diagnosed at approximate age of 72 years. His disease course has been stable. There are no hypoglycemic associated symptoms. Pertinent negatives for hypoglycemia include no confusion, pallor or seizures. Associated symptoms include fatigue. Pertinent negatives for diabetes include no polydipsia, no polyphagia, no polyuria and no weakness. There are no hypoglycemic complications. Symptoms are stable. There are no diabetic complications. Risk factors for coronary artery disease include diabetes mellitus, dyslipidemia, family history, male sex, hypertension, obesity, sedentary lifestyle and tobacco exposure. Current diabetic treatment includes intensive insulin  program and oral agent (monotherapy) (and Mounjaro ). He is compliant with treatment most of the time. His weight is stable. He is following a generally healthy diet. Meal planning includes avoidance of concentrated sweets. He has not had a previous visit with a dietitian. He participates in exercise daily (walks at the mall daily). His home blood glucose trend is fluctuating minimally. His breakfast blood glucose range is generally >200 mg/dl. His lunch blood glucose range is generally >200 mg/dl. His dinner blood glucose range is generally >200 mg/dl. His bedtime blood glucose range is generally >200 mg/dl. His overall blood glucose range is >200 mg/dl. (He presents today, with his CGM showing above target glycemic profile overall.  His most recent A1c on 11/5 was 9.3%,unchanged from last visit.  Analysis of his CGM shows TIR 19%, TAR 81% (26% in level  2 hyperglycemia), TBR 0% with a GMI of 8.6%.   He was changed to Mounjaro  and he notes he doesn't think it is working as well as the Ozempic  did.) An ACE inhibitor/angiotensin II receptor blocker is being taken. He does not see a podiatrist.Eye exam is current.   Review of systems  Constitutional: +stable body weight,  current Body mass index is 32.26 kg/m. , no fatigue, no subjective hyperthermia, no subjective hypothermia Eyes: no blurry vision, no xerophthalmia ENT: no sore throat, no nodules palpated in throat, no dysphagia/odynophagia, no hoarseness Cardiovascular: no chest pain, no shortness of breath, no palpitations, no leg swelling Respiratory: no cough, no shortness of breath Gastrointestinal: no nausea/vomiting/diarrhea Musculoskeletal: no muscle/joint aches Skin: no rashes, no hyperemia Neurological: no tremors, no numbness, no tingling, no dizziness Psychiatric: no depression, no anxiety   Objective:    BP  112/66 (BP Location: Left Arm, Patient Position: Sitting, Cuff Size: Large)   Pulse 83   Ht 5' 10 (1.778 m)   Wt 224 lb 12.8 oz (102 kg)   BMI 32.26 kg/m   Wt Readings from Last 3 Encounters:  02/07/24 224 lb 12.8 oz (102 kg)  11/07/23 219 lb 9.6 oz (99.6 kg)  07/25/23 220 lb 3.2 oz (99.9 kg)    BP Readings from Last 3 Encounters:  02/07/24 112/66  01/15/24 (!) 149/64  11/07/23 122/60     Physical Exam- Limited  Constitutional:  Body mass index is 32.26 kg/m. , not in acute distress, normal state of mind Eyes:  EOMI, no exophthalmos Musculoskeletal: no gross deformities, strength intact in all four extremities, no gross restriction of joint movements Skin:  no rashes, no hyperemia Neurological: no tremor with outstretched hands   Diabetic Foot Exam - Simple   No data filed     Recent Results (from the past 2160 hours)  Urinalysis, Routine w reflex microscopic     Status: Abnormal   Collection Time: 01/15/24  2:51 PM  Result Value Ref Range    Specific Gravity, UA 1.025 1.005 - 1.030   pH, UA 6.0 5.0 - 7.5   Color, UA Yellow Yellow   Appearance Ur Clear Clear   Leukocytes,UA Negative Negative   Protein,UA 1+ (A) Negative/Trace   Glucose, UA 3+ (A) Negative   Ketones, UA 1+ (A) Negative   RBC, UA Trace (A) Negative   Bilirubin, UA Negative Negative   Urobilinogen, Ur 0.2 0.2 - 1.0 mg/dL   Nitrite, UA Negative Negative   Microscopic Examination See below:     Comment: Microscopic was indicated and was performed.  Microscopic Examination     Status: Abnormal   Collection Time: 01/15/24  2:51 PM   Urine  Result Value Ref Range   WBC, UA 0-5 0 - 5 /hpf   RBC, Urine 3-10 (A) 0 - 2 /hpf   Epithelial Cells (non renal) 0-10 0 - 10 /hpf   Mucus, UA Present (A) Not Estab.   Bacteria, UA None seen None seen/Few  HgB A1c     Status: Abnormal   Collection Time: 02/07/24  2:43 PM  Result Value Ref Range   Hemoglobin A1C 9.3 (A) 4.0 - 5.6 %    Comment: This was collected on 12/05/2023 at the [facility] in [place], [place]   HbA1c POC (<> result, manual entry)     HbA1c, POC (prediabetic range)     HbA1c, POC (controlled diabetic range)          Recent Results (from the past 2160 hours)  Urinalysis, Routine w reflex microscopic     Status: Abnormal   Collection Time: 01/15/24  2:51 PM  Result Value Ref Range   Specific Gravity, UA 1.025 1.005 - 1.030   pH, UA 6.0 5.0 - 7.5   Color, UA Yellow Yellow   Appearance Ur Clear Clear   Leukocytes,UA Negative Negative   Protein,UA 1+ (A) Negative/Trace   Glucose, UA 3+ (A) Negative   Ketones, UA 1+ (A) Negative   RBC, UA Trace (A) Negative   Bilirubin, UA Negative Negative   Urobilinogen, Ur 0.2 0.2 - 1.0 mg/dL   Nitrite, UA Negative Negative   Microscopic Examination See below:     Comment: Microscopic was indicated and was performed.  Microscopic Examination     Status: Abnormal   Collection Time: 01/15/24  2:51 PM   Urine  Result Value  Ref Range   WBC, UA 0-5 0 - 5 /hpf    RBC, Urine 3-10 (A) 0 - 2 /hpf   Epithelial Cells (non renal) 0-10 0 - 10 /hpf   Mucus, UA Present (A) Not Estab.   Bacteria, UA None seen None seen/Few  HgB A1c     Status: Abnormal   Collection Time: 02/07/24  2:43 PM  Result Value Ref Range   Hemoglobin A1C 9.3 (A) 4.0 - 5.6 %    Comment: This was collected on 12/05/2023 at the [facility] in [place], [place]   HbA1c POC (<> result, manual entry)     HbA1c, POC (prediabetic range)     HbA1c, POC (controlled diabetic range)           Assessment & Plan:   1) Uncontrolled type 2 diabetes mellitus with hyperglycemia (HCC)  - Fitzgerald R Orihuela has currently uncontrolled symptomatic type 2 DM since 69 years of age.  He presents today, with his CGM showing above target glycemic profile overall.  His most recent A1c on 11/5 was 9.3%,unchanged from last visit.  Analysis of his CGM shows TIR 19%, TAR 81% (26% in level 2 hyperglycemia), TBR 0% with a GMI of 8.6%.   He was changed to Mounjaro  and he notes he doesn't think it is working as well as the Ozempic  did.   - Recent labs reviewed.  -his diabetes is complicated by obesity/sedentary life, and BRON SNELLINGS remains at a high risk for more acute and chronic complications which include CAD, CVA, CKD, retinopathy, and neuropathy. These are all discussed in detail with the patient.  - Nutritional counseling repeated/built upon at each appointment.  - The patient admits there is a room for improvement in their diet and drink choices. -  Suggestion is made for the patient to avoid simple carbohydrates from their diet including Cakes, Sweet Desserts / Pastries, Ice Cream, Soda (diet and regular), Sweet Tea, Candies, Chips, Cookies, Sweet Pastries, Store Bought Juices, Alcohol in Excess of 1-2 drinks a day, Artificial Sweeteners, Coffee Creamer, and Sugar-free Products. This will help patient to have stable blood glucose profile and potentially avoid unintended weight gain.   - I encouraged the  patient to switch to unprocessed or minimally processed complex starch and increased protein intake (animal or plant source), fruits, and vegetables.   - Patient is advised to stick to a routine mealtimes to eat 3 meals a day and avoid unnecessary snacks (to snack only to correct hypoglycemia).  -He follows with a dietitian in [place].  - I have approached him with the following individualized plan to manage diabetes and patient agrees:   -He will continue to require intensive treatment with basal/bolus insulin  in order for him to maintain control of diabetes to target.    -He is advised to continue his Tresiba  100 units SQ daily and Novolog  20-26 units TID with meals if glucose is above 90 and he is eating (Specific instructions on how to titrate insulin  dosage based on glucose readings given to patient in writing).  He is advised to continue  Metformin  1000 mg po twice daily with meals and will increase his Mounjaro  to 10 mg SQ weekly.   -He is encouraged to continue using CGM to monitor blood glucose at least 4 times per day, before meals and at bedtime, and call the clinic for readings less than 70 or greater than 200 for 3 tests in a row.  - Patient specific target  A1c;  LDL,  HDL, Triglycerides, and  Waist Circumference were discussed in detail.  2) BP/HTN:  His blood pressure is controlled to target.  He is advised to continue his current medications as prescribed by his PCP.  3) Lipids/HPL:  His most recent lipid panel from 12/05/23 shows controlled LDL at 32.  He is advised to continue Atorvastatin  40 mg po daily at bedtime, and Fish Oil 3g po daily.   4)  Weight/Diet:  His Body mass index is 32.26 kg/m.-clearly complicating the care of his diabetes.  He is a candidate for modest weight loss.  CDE Consult will be initiated , exercise, and detailed carbohydrates information provided.  5) Chronic Care/Health Maintenance: -he is on ACEI/ARB and Statin medications and is encouraged to  continue to follow up with Ophthalmology, Dentist,  Podiatrist at least yearly or according to recommendations, and advised to  stay away from smoking. I have recommended yearly flu vaccine and pneumonia vaccination at least every 5 years; moderate intensity exercise for up to 150 minutes weekly; and  sleep for at least 7 hours a day.  - I advised patient to maintain close follow up with Vasireddy, Sabitha, MD for primary care needs.     I spent  44  minutes in the care of the patient today including review of labs from CMP, Lipids, Thyroid Function, Hematology (current and previous including abstractions from other facilities); face-to-face time discussing  his blood glucose readings/logs, discussing hypoglycemia and hyperglycemia episodes and symptoms, medications doses, his options of short and long term treatment based on the latest standards of care / guidelines;  discussion about incorporating lifestyle medicine;  and documenting the encounter. Risk reduction counseling performed per USPSTF guidelines to reduce obesity and cardiovascular risk factors.     Please refer to Patient Instructions for Blood Glucose Monitoring and Insulin /Medications Dosing Guide  in media tab for additional information. Please  also refer to  Patient Self Inventory in the Media  tab for reviewed elements of pertinent patient history.  Salomon JONELLE Penner participated in the discussions, expressed understanding, and voiced agreement with the above plans.  All questions were answered to his satisfaction. he is encouraged to contact clinic should he have any questions or concerns prior to his return visit.   Follow up plan: - Return in about 4 months (around 06/06/2024) for Diabetes F/U with A1c in office, No previsit labs, Bring meter and logs.  Benton Rio, [facility] [address] [place], [place] [address] Phone: [phone] Fax: [phone]  02/07/2024, 4:05 PM   "

## 2024-06-10 ENCOUNTER — Ambulatory Visit: Admitting: Nurse Practitioner
